# Patient Record
Sex: Female | Born: 1970 | Race: White | Hispanic: No | Marital: Married | Smoking: Former smoker
Health system: Southern US, Community
[De-identification: ages and names within clinical notes are randomized; demographics above are authoritative.]

## PROBLEM LIST (undated history)

## (undated) DIAGNOSIS — F319 Bipolar disorder, unspecified: Secondary | ICD-10-CM

## (undated) DIAGNOSIS — R569 Unspecified convulsions: Secondary | ICD-10-CM

## (undated) HISTORY — DX: Unspecified convulsions: R56.9

## (undated) HISTORY — PX: APPENDECTOMY: SHX54

## (undated) HISTORY — DX: Bipolar disorder, unspecified: F31.9

---

## 2008-04-19 ENCOUNTER — Emergency Department (HOSPITAL_COMMUNITY): Admission: EM | Admit: 2008-04-19 | Discharge: 2008-04-19 | Payer: Self-pay | Admitting: Emergency Medicine

## 2008-09-20 ENCOUNTER — Inpatient Hospital Stay (HOSPITAL_COMMUNITY): Admission: AD | Admit: 2008-09-20 | Discharge: 2008-09-21 | Payer: Self-pay | Admitting: *Deleted

## 2008-09-20 ENCOUNTER — Emergency Department (HOSPITAL_COMMUNITY): Admission: EM | Admit: 2008-09-20 | Discharge: 2008-09-20 | Payer: Self-pay | Admitting: Emergency Medicine

## 2008-09-20 ENCOUNTER — Ambulatory Visit: Payer: Self-pay | Admitting: *Deleted

## 2010-07-06 LAB — CBC
Hemoglobin: 11.5 g/dL — ABNORMAL LOW (ref 12.0–15.0)
MCHC: 32.8 g/dL (ref 30.0–36.0)
Platelets: 254 10*3/uL (ref 150–400)
RDW: 18.1 % — ABNORMAL HIGH (ref 11.5–15.5)

## 2010-07-06 LAB — BASIC METABOLIC PANEL
BUN: 3 mg/dL — ABNORMAL LOW (ref 6–23)
CO2: 26 mEq/L (ref 19–32)
Calcium: 8.9 mg/dL (ref 8.4–10.5)
Creatinine, Ser: 0.76 mg/dL (ref 0.4–1.2)
GFR calc non Af Amer: >60 mL/min (ref >60)
Glucose, Bld: 90 mg/dL (ref 70–99)
Sodium: 140 mEq/L (ref 135–145)

## 2010-07-06 LAB — RAPID URINE DRUG SCREEN, HOSP PERFORMED
Amphetamines: POSITIVE — AB
Cocaine: NOT DETECTED
Tetrahydrocannabinol: POSITIVE — AB

## 2010-07-06 LAB — TRICYCLICS SCREEN, URINE: TCA Scrn: NOT DETECTED

## 2010-07-06 LAB — POCT PREGNANCY, URINE: Preg Test, Ur: NEGATIVE

## 2010-07-06 LAB — DIFFERENTIAL
Basophils Absolute: 0 10*3/uL (ref 0.0–0.1)
Basophils Relative: 0 % (ref 0–1)
Lymphocytes Relative: 29 % (ref 12–46)
Neutro Abs: 4.3 10*3/uL (ref 1.7–7.7)
Neutrophils Relative %: 63 % (ref 43–77)

## 2010-07-13 LAB — COMPREHENSIVE METABOLIC PANEL
Alkaline Phosphatase: 61 U/L (ref 39–117)
BUN: 9 mg/dL (ref 6–23)
Chloride: 105 mEq/L (ref 96–112)
Creatinine, Ser: 0.65 mg/dL (ref 0.4–1.2)
Glucose, Bld: 91 mg/dL (ref 70–99)
Potassium: 3.7 mEq/L (ref 3.5–5.1)
Total Bilirubin: 0.5 mg/dL (ref 0.3–1.2)

## 2010-07-13 LAB — CBC
HCT: 35.4 % — ABNORMAL LOW (ref 36.0–46.0)
Hemoglobin: 11.6 g/dL — ABNORMAL LOW (ref 12.0–15.0)
MCV: 85.9 fL (ref 78.0–100.0)
Platelets: 279 10*3/uL (ref 150–400)
RDW: 16.5 % — ABNORMAL HIGH (ref 11.5–15.5)
WBC: 6.4 10*3/uL (ref 4.0–10.5)

## 2010-07-13 LAB — URINALYSIS, ROUTINE W REFLEX MICROSCOPIC
Bilirubin Urine: NEGATIVE
Ketones, ur: NEGATIVE mg/dL
Leukocytes, UA: NEGATIVE
Nitrite: NEGATIVE
Protein, ur: NEGATIVE mg/dL
pH: 6 (ref 5.0–8.0)

## 2010-07-13 LAB — WET PREP, GENITAL
Trich, Wet Prep: NONE SEEN
Yeast Wet Prep HPF POC: NONE SEEN

## 2010-07-13 LAB — DIFFERENTIAL
Basophils Absolute: 0.1 10*3/uL (ref 0.0–0.1)
Basophils Relative: 1 % (ref 0–1)
Lymphocytes Relative: 26 % (ref 12–46)
Neutro Abs: 4.1 10*3/uL (ref 1.7–7.7)
Neutrophils Relative %: 64 % (ref 43–77)

## 2010-07-13 LAB — HCG, QUANTITATIVE, PREGNANCY: hCG, Beta Chain, Quant, S: 17387 m[IU]/mL — ABNORMAL HIGH (ref <5)

## 2010-07-13 LAB — URINE MICROSCOPIC-ADD ON

## 2010-07-13 LAB — LIPASE, BLOOD: Lipase: 18 U/L (ref 11–59)

## 2010-07-13 LAB — PREGNANCY, URINE: Preg Test, Ur: POSITIVE

## 2010-08-11 NOTE — H&P (Signed)
Sherry Tate, Sherry Tate             ACCOUNT NO.:  [id number]   MEDICAL RECORD NO.:  [id number]          PATIENT TYPE:  IPS   LOCATION:  0303                          [facility]ILITY[facility]  [facility]   PHYSICIAN:  Syed T. Arfeen, M.D.   DATE OF BIRTH:  1970/11/05   DATE OF ADMISSION:  09/20/2008  DATE OF DISCHARGE:  09/21/2008                       PSYCHIATRIC ADMISSION ASSESSMENT   IDENTIFYING INFORMATION/JUSTIFICATION FOR ADMISSION AND CARE:  The  patient is a 40 year old female voluntarily admitted on September 20, 2008.   HISTORY OF PRESENT ILLNESS:  The patient reports that she was sent to  the emergency room after her therapist thought that she needed more  intensive therapy.  She was concerned about overtaking her Xanax and  feels this was misinterpreted and that she was having suicidal thoughts.  She does report a history of increased anxiety and was to see her  psychiatrist with this situation. She denies any suicidal thoughts.  She  wants to be there for as long as she can for her children, reporting  that she has an older child that has a history of some mental  disabilities.  She denies any substance use.  Again denies any suicidal  thoughts and is anxious to be discharged.   PAST PSYCHIATRIC HISTORY:  First admission she has a therapist named Vernell Leep. Sees Dr. Lafayette Dragon and has an appointment on September 21, 2008.   SOCIAL HISTORY:  The patient is married and has two children.  No legal  problems.   FAMILY HISTORY:  None.   ALCOHOL/DRUG HISTORY:  The patient smokes, but denies any alcohol or  drug use.   PRIMARY CARE PHYSICIAN:  Dr. Katrinka Blazing in [facility].   PAST MEDICAL HISTORY:  Denies any acute or chronic health issues.   MEDICATIONS:  1. Cymbalta 60 mg daily.  2. Xanax 1 mg four times a day, taking 1 mg twice a day and taking 2      mg at bedtime.  3. Adderall.   DRUG ALLERGIES:  No known allergies.   PHYSICAL EXAMINATION:  GENERAL:  This is a young female fully assessed  at [facility] emergency department.  She was seen at [facility]. She  appears in no acute distress. She offers no complaints.  VITAL SIGNS:  Temperature 97.4. Heart rate 91, respiratory rate 20.  Blood pressure is 117/64.   LABORATORY DATA:  Her B-met is within normal limits.  Alcohol level less  than 5.  Urine drug screen is positive for benzodiazepines, positive for  amphetamines, positive for marijuana. Hemoglobin 11.5, hematocrit 34.9.   MENTAL STATUS EXAM:  She is fully alert, cooperative. Denies any  hallucinations. No delusional statements. She provides a good history.  Her memory is intact.  Judgment and insight appear good.   DIAGNOSES:  AXIS I:  Anxiety disorder.  Cannabis abuse.  AXIS II:  Deferred.  AXIS III:  No known medical conditions.  AXIS IV:  Possible psychosocial problems rated to the children's  illness.  AXIS V:  Current is 50.   PLAN:  Our plan is to contact her husband for concerns and support,  which was done. The patient's husband has no concerns about the patient  returning home. We clarified appointment today with Dr. Lafayette Dragon and husband  will come pick up the patient to be there for appointment with her  psychiatrist.  The patient also was agreeable to the IOP program.  Information was provided and listed on her discharge instructions.   DISPOSITION:  The patient was discharged to home with her husband and  got to meet with  Dr. Lafayette Dragon at 12:15 on Saturday 06/26.  Discharge  instructions listed.   MEDICATIONS:  No prescriptions.   DISCHARGE DIAGNOSES:  AXIS I:  Anxiety disorder and Cannabis abuse.  AXIS II:  Deferred.  AXIS III:  No known medical conditions.  AXIS IV:.  Possible psychosocial problems related to child's illness.  AXIS V:  Current is 65.      Landry Corporal, N.P.      Syed T. Lolly Mustache, M.D.  Electronically Signed    JO/MEDQ  D:  09/21/2008  T:  09/21/2008  Job:  [id number]

## 2010-08-14 NOTE — Discharge Summary (Signed)
NAMEIVELISE, CASTILLO             ACCOUNT NO.:  [id number]   MEDICAL RECORD NO.:  [id number]          PATIENT TYPE:  IPS   LOCATION:  0303                          [facility]ACILITY:  [facility]   PHYSICIAN:  Syed T. Arfeen, M.D.   DATE OF BIRTH:  04-19-1970   DATE OF ADMISSION:  09/20/2008  DATE OF DISCHARGE:  09/21/2008                               DISCHARGE SUMMARY   The patient is a 40 year old white married woman.  She was admitted due  to increased anxiety and depression.  She was seen by her therapist, Vernell Leep, on June 25, and she endorsed worsening of her depression and  believed she may end up taking more Xanax.  She also mentioned passive  suicidal ideation but no plan.  She mentioned that she talked to  therapist that she might need intensive outpatient program.  However,  when she evaluated by assessment in the ER, it was felt that she needed  inpatient treatment.  The patient reported that she had increased  anxiety for past few weeks concerned about her 2 children, who are 12-  years and 8-years old, and one of them requires special needs.  The  patient denies any suicidal thoughts in the unit.  The patient was very  upset and tearful and requesting to be discharged and mentioned that she  never had any suicidal thoughts and is all miscommunication.  She said  that she thought she will assess for intensive outpatient treatment and  did not realize that she was coming for inpatient treatment.  The  patient was concerned about the child who has some mental disability,  and she wanted to go home.  Please see admission note for details.   PAST PSYCHIATRIC HISTORY:  The patient denies ever being admitted in a  psych unit.  She denies ever past history of suicidal attempt.  She is  scheduled to see Dr. Evelene Croon today at 12:30 for assessment.  She has been  getting Cymbalta from the [facility], and she is also getting Xanax up to  4 a day as needed.  The patient has follow up with Dr.  Evelene Croon.   MEDICAL HISTORY:  She denies any history of active medical illness.   SUBSTANCE ABUSE:  She denies any history of substance abuse.   HOSPITAL COURSE:  The patient was admitted and restarted her on Cymbalta  and Xanax p.r.n.  The patient offers no complaints but requested to be  discharged as she is concerned about her children, especially 32-year-old  who has mild MR.  She does not exhibit any psychosis or suicidal  thoughts.  She did not complain of any side effects of medication.  She  reported a supportive husband.  We talked about intensive outpatient  treatment which she agreed, and she said that was her plan in the  beginning, and she still wanted to be in an intensive outpatient  program.  A family session was scheduled with the husband.  Counselor  spoke with the patient's husband about the potential discharge.  Husband  reports he feels comfortable with the patient being discharged today.  Husband  does not believe that it was ever suicidal but she was only  depressed and concerned about over using her Xanax.  Husband said there  are no guns in the home, and he has no concern for the patient's safety.  The husband agreed to take the patient back home.  We talked about this  IOP program.  The patient said she will start the IOP program early next  week.  The patient also had an appointment the same day at 12:30 p.m.  with Dr. Evelene Croon.   CONDITION ON DISCHARGE:  Remarkably improved.  Increased coping and  social skills.  She denies any suicidal thoughts or homicidal thoughts  or any hallucination.  Her affect appears to be bright, and there were  no side effects of the medication.   DISCHARGE DIAGNOSIS:  AXIS I:  Major depressive disorder, most recent  episode.  AXIS II:  Deferred.  AXIS III:  None.  AXIS IV:  Mild.  AXIS V:  60.   DISCHARGE MEDICATIONS:  Will resume her Cymbalta 60 mg daily and Xanax 1  mg 1-3 tablets as needed.  However, the patient noted not taking  the  Xanax more than 2 a day.  She was also instructed to resume her Adderall  which was given by Dr. Evelene Croon as needed.   DISCHARGE DISPOSITION:  As mentioned above, the patient was discharged  home with her husband, and she would also see Dr. Evelene Croon at 12:30 p.m. on  June 26.      Syed T. Lolly Mustache, M.D.  Electronically Signed     STA/MEDQ  D:  09/25/2008  T:  09/25/2008  Job:  [id number]

## 2010-12-08 ENCOUNTER — Other Ambulatory Visit: Payer: Self-pay | Admitting: Orthopedic Surgery

## 2010-12-08 DIAGNOSIS — M545 Low back pain: Secondary | ICD-10-CM

## 2010-12-16 ENCOUNTER — Ambulatory Visit
Admission: RE | Admit: 2010-12-16 | Discharge: 2010-12-16 | Disposition: A | Discharge status: Home / Self Care | Payer: Managed Care, Other (non HMO) | Source: Ambulatory Visit | Attending: Orthopedic Surgery | Admitting: Orthopedic Surgery

## 2010-12-16 DIAGNOSIS — M545 Low back pain: Secondary | ICD-10-CM

## 2011-01-18 ENCOUNTER — Other Ambulatory Visit: Payer: Self-pay | Admitting: Internal Medicine

## 2011-01-22 ENCOUNTER — Inpatient Hospital Stay
Admission: RE | Admit: 2011-01-22 | Discharge: 2011-01-22 | Payer: Managed Care, Other (non HMO) | Source: Ambulatory Visit | Attending: Internal Medicine | Admitting: Internal Medicine

## 2011-02-11 ENCOUNTER — Other Ambulatory Visit: Payer: Managed Care, Other (non HMO)

## 2011-02-12 ENCOUNTER — Ambulatory Visit
Admission: RE | Admit: 2011-02-12 | Discharge: 2011-02-12 | Disposition: A | Discharge status: Home / Self Care | Payer: Managed Care, Other (non HMO) | Source: Ambulatory Visit | Attending: Internal Medicine | Admitting: Internal Medicine

## 2011-02-12 MED ORDER — GADOBENATE DIMEGLUMINE 529 MG/ML IV SOLN
12.0000 mL | Freq: Once | INTRAVENOUS | Status: AC | PRN
  Administered 2011-02-12: 12 mL via INTRAVENOUS

## 2012-01-11 ENCOUNTER — Other Ambulatory Visit: Payer: Self-pay | Admitting: Pain Medicine

## 2012-01-11 DIAGNOSIS — M545 Low back pain: Secondary | ICD-10-CM

## 2012-01-18 ENCOUNTER — Inpatient Hospital Stay: Admission: RE | Admit: 2012-01-18 | Payer: Managed Care, Other (non HMO) | Source: Ambulatory Visit

## 2012-01-21 ENCOUNTER — Other Ambulatory Visit: Payer: Managed Care, Other (non HMO)

## 2012-10-12 ENCOUNTER — Other Ambulatory Visit: Payer: Self-pay | Admitting: Family Medicine

## 2012-10-12 DIAGNOSIS — R102 Pelvic and perineal pain: Secondary | ICD-10-CM

## 2012-10-16 ENCOUNTER — Ambulatory Visit
Admission: RE | Admit: 2012-10-16 | Discharge: 2012-10-16 | Disposition: A | Discharge status: Home / Self Care | Payer: Managed Care, Other (non HMO) | Source: Ambulatory Visit | Attending: Family Medicine | Admitting: Family Medicine

## 2012-10-16 DIAGNOSIS — R102 Pelvic and perineal pain: Secondary | ICD-10-CM

## 2013-02-27 ENCOUNTER — Other Ambulatory Visit: Payer: Self-pay

## 2013-02-27 DIAGNOSIS — Z1231 Encounter for screening mammogram for malignant neoplasm of breast: Secondary | ICD-10-CM

## 2013-04-03 ENCOUNTER — Ambulatory Visit
Admission: RE | Admit: 2013-04-03 | Discharge: 2013-04-03 | Disposition: A | Discharge status: Home / Self Care | Payer: Managed Care, Other (non HMO) | Source: Ambulatory Visit

## 2013-04-03 DIAGNOSIS — Z1231 Encounter for screening mammogram for malignant neoplasm of breast: Secondary | ICD-10-CM

## 2013-07-05 ENCOUNTER — Encounter (HOSPITAL_COMMUNITY): Payer: Self-pay | Admitting: Emergency Medicine

## 2013-07-05 ENCOUNTER — Emergency Department (HOSPITAL_COMMUNITY): Payer: Managed Care, Other (non HMO)

## 2013-07-05 ENCOUNTER — Emergency Department (HOSPITAL_COMMUNITY)
Admission: EM | Admit: 2013-07-05 | Discharge: 2013-07-05 | Disposition: A | Discharge status: Home / Self Care | Payer: Managed Care, Other (non HMO) | Attending: Emergency Medicine | Admitting: Emergency Medicine

## 2013-07-05 DIAGNOSIS — Z87891 Personal history of nicotine dependence: Secondary | ICD-10-CM | POA: Insufficient documentation

## 2013-07-05 DIAGNOSIS — R1013 Epigastric pain: Secondary | ICD-10-CM | POA: Insufficient documentation

## 2013-07-05 DIAGNOSIS — K224 Dyskinesia of esophagus: Secondary | ICD-10-CM | POA: Insufficient documentation

## 2013-07-05 LAB — CBC
HCT: 42.7 % (ref 36.0–46.0)
Hemoglobin: 14.3 g/dL (ref 12.0–15.0)
MCH: 32.4 pg (ref 26.0–34.0)
MCHC: 33.5 g/dL (ref 30.0–36.0)
MCV: 96.6 fL (ref 78.0–100.0)
Platelets: 242 10*3/uL (ref 150–400)
RBC: 4.42 MIL/uL (ref 3.87–5.11)
RDW: 12.8 % (ref 11.5–15.5)
WBC: 5.9 10*3/uL (ref 4.0–10.5)

## 2013-07-05 LAB — URINE MICROSCOPIC-ADD ON

## 2013-07-05 LAB — URINALYSIS, ROUTINE W REFLEX MICROSCOPIC
Bilirubin Urine: NEGATIVE
Glucose, UA: NEGATIVE mg/dL
KETONES UR: NEGATIVE mg/dL
Nitrite: NEGATIVE
PROTEIN: NEGATIVE mg/dL
Specific Gravity, Urine: 1.008 (ref 1.005–1.030)
UROBILINOGEN UA: 0.2 mg/dL (ref 0.0–1.0)
pH: 7.5 (ref 5.0–8.0)

## 2013-07-05 LAB — COMPREHENSIVE METABOLIC PANEL
ALT: 26 U/L (ref 0–35)
AST: 23 U/L (ref 0–37)
Albumin: 4.4 g/dL (ref 3.5–5.2)
Alkaline Phosphatase: 52 U/L (ref 39–117)
BILIRUBIN TOTAL: 0.2 mg/dL — AB (ref 0.3–1.2)
BUN: 9 mg/dL (ref 6–23)
CO2: 27 meq/L (ref 19–32)
Calcium: 10 mg/dL (ref 8.4–10.5)
Chloride: 103 mEq/L (ref 96–112)
Creatinine, Ser: 0.79 mg/dL (ref 0.50–1.10)
GFR calc Af Amer: >90 mL/min (ref >90)
GLUCOSE: 93 mg/dL (ref 70–99)
Potassium: 4.8 mEq/L (ref 3.7–5.3)
SODIUM: 143 meq/L (ref 137–147)
Total Protein: 7.4 g/dL (ref 6.0–8.3)

## 2013-07-05 LAB — LIPASE, BLOOD: Lipase: 19 U/L (ref 11–59)

## 2013-07-05 LAB — I-STAT TROPONIN, ED: Troponin i, poc: 0 ng/mL (ref 0.00–0.08)

## 2013-07-05 LAB — POC URINE PREG, ED: Preg Test, Ur: NEGATIVE

## 2013-07-05 MED ORDER — GI COCKTAIL ~~LOC~~
30.0000 mL | Freq: Once | ORAL | Status: AC
  Administered 2013-07-05: 30 mL via ORAL
  Filled 2013-07-05: qty 30

## 2013-07-05 MED ORDER — IOHEXOL 300 MG/ML  SOLN
25.0000 mL | INTRAMUSCULAR | Status: AC
  Administered 2013-07-05: 25 mL via ORAL

## 2013-07-05 MED ORDER — LORAZEPAM 2 MG/ML IJ SOLN
1.0000 mg | Freq: Once | INTRAMUSCULAR | Status: AC
  Administered 2013-07-05: 1 mg via INTRAVENOUS
  Filled 2013-07-05: qty 1

## 2013-07-05 MED ORDER — SODIUM CHLORIDE 0.9 % IV BOLUS (SEPSIS)
1000.0000 mL | Freq: Once | INTRAVENOUS | Status: AC
  Administered 2013-07-05: 1000 mL via INTRAVENOUS

## 2013-07-05 MED ORDER — IOHEXOL 300 MG/ML  SOLN
80.0000 mL | Freq: Once | INTRAMUSCULAR | Status: AC | PRN
  Administered 2013-07-05: 75 mL via INTRAVENOUS

## 2013-07-05 MED ORDER — DIAZEPAM 5 MG PO TABS: 5.0000 mg | ORAL_TABLET | Freq: Three times a day (TID) | ORAL | Status: DC | PRN

## 2013-07-05 MED ORDER — ONDANSETRON HCL 4 MG/2ML IJ SOLN
4.0000 mg | Freq: Once | INTRAMUSCULAR | Status: AC
  Administered 2013-07-05: 4 mg via INTRAVENOUS
  Filled 2013-07-05: qty 2

## 2013-07-05 MED ORDER — DIPHENHYDRAMINE HCL 50 MG/ML IJ SOLN
25.0000 mg | Freq: Once | INTRAMUSCULAR | Status: AC
  Administered 2013-07-05: 25 mg via INTRAVENOUS
  Filled 2013-07-05: qty 1

## 2013-07-05 MED ORDER — MORPHINE SULFATE 4 MG/ML IJ SOLN
4.0000 mg | Freq: Once | INTRAMUSCULAR | Status: AC
  Administered 2013-07-05: 4 mg via INTRAVENOUS
  Filled 2013-07-05: qty 1

## 2013-07-05 NOTE — ED Provider Notes (Addendum)
CSN: [id number]     Arrival date & time 07/05/13  [id number] History   First MD Initiated Contact with Patient 07/05/13 [phone]     Chief Complaint  Patient presents with  . Chest Pain     (Consider location/radiation/quality/duration/timing/severity/associated sxs/prior Treatment) Patient is a 43 y.o. female presenting with abdominal pain. The history is provided by the patient.  Abdominal Pain Pain location:  Epigastric Pain quality: sharp, stabbing and throbbing   Pain radiates to:  Does not radiate Pain severity:  Severe Onset quality:  Sudden Duration:  12 hours Timing:  Constant Progression:  Worsening Chronicity:  New Context comment:  Started abruptly at 8pm when she was doing nothing.  she had eaten appx 1-2 hours prior to start of pain Relieved by:  Nothing Worsened by:  Deep breathing and movement Ineffective treatments:  None tried Associated symptoms: no chest pain, no chills, no constipation, no cough, no diarrhea, no dysuria, no fever, no nausea, no shortness of breath and no vomiting   Risk factors comment:  Hx of complicated appy with bowel perf  s/p 3 surgerys 11 years ago   History reviewed. No pertinent past medical history. History reviewed. No pertinent past surgical history. History reviewed. No pertinent family history. History  Substance Use Topics  . Smoking status: Former Games developermoker  . Smokeless tobacco: Never Used  . Alcohol Use: No   OB History   Grav Para Term Preterm Abortions TAB SAB Ect Mult Living                 Review of Systems  Constitutional: Negative for fever and chills.  Respiratory: Negative for cough and shortness of breath.   Cardiovascular: Negative for chest pain.  Gastrointestinal: Positive for abdominal pain. Negative for nausea, vomiting, diarrhea and constipation.  Genitourinary: Negative for dysuria.  All other systems reviewed and are negative.     Allergies  Sulfa antibiotics  Home Medications  No current outpatient  prescriptions on file. BP 103/67  Pulse 83  Temp(Src) 97.9 F (36.6 C) (Oral)  Resp 20  Ht 5\' 6"  (1.676 m)  Wt 127 lb 3.2 oz (57.698 kg)  BMI 20.54 kg/m2  SpO2 100%  LMP 06/28/2013 Physical Exam  Nursing note and vitals reviewed. Constitutional: She is oriented to person, place, and time. She appears well-developed and well-nourished. She appears distressed.  Appears uncomfortable and intermittently groans in pain during the exam sitting straight up  HENT:  Head: Normocephalic and atraumatic.  Mouth/Throat: Oropharynx is clear and moist.  Eyes: Conjunctivae and EOM are normal. Pupils are equal, round, and reactive to light.  Neck: Normal range of motion. Neck supple.  Cardiovascular: Normal rate, regular rhythm and intact distal pulses.   No murmur heard. Pulmonary/Chest: Effort normal and breath sounds normal. No respiratory distress. She has no wheezes. She has no rales.  Abdominal: Soft. She exhibits no distension. There is tenderness in the epigastric area. There is guarding. There is no rebound.  Musculoskeletal: Normal range of motion. She exhibits no edema and no tenderness.  Neurological: She is alert and oriented to person, place, and time.  Skin: Skin is warm and dry. No rash noted. No erythema.  Psychiatric: She has a normal mood and affect. Her behavior is normal.    ED Course  Procedures (including critical care time) Labs Review Labs Reviewed  COMPREHENSIVE METABOLIC PANEL - Abnormal; Notable for the following:    Total Bilirubin 0.2 (*)    All other components within normal limits  URINALYSIS, ROUTINE W REFLEX MICROSCOPIC - Abnormal; Notable for the following:    Hgb urine dipstick SMALL (*)    Leukocytes, UA TRACE (*)    All other components within normal limits  URINE MICROSCOPIC-ADD ON - Abnormal; Notable for the following:    Squamous Epithelial / LPF MANY (*)    Bacteria, UA MANY (*)    All other components within normal limits  CBC  LIPASE, BLOOD   I-STAT TROPOININ, ED  POC URINE PREG, ED  I-STAT CG4 LACTIC ACID, ED   Imaging Review Ct Abdomen Pelvis W Contrast  07/05/2013   CLINICAL DATA:  43 year old female with epigastric pain. Initial encounter.  EXAM: CT ABDOMEN AND PELVIS WITH CONTRAST  TECHNIQUE: Multidetector CT imaging of the abdomen and pelvis was performed using the standard protocol following bolus administration of intravenous contrast.  CONTRAST:  75mL OMNIPAQUE IOHEXOL 300 MG/ML  SOLN  COMPARISON:  Acute abdominal series 0804 hr the same day.  FINDINGS: No pericardial or pleural effusion.  Minor lung base atelectasis.  Scoliosis. Lower lumbar chronic disc and endplate degeneration. No acute osseous abnormality identified.  Small volume pelvic free fluid. Uterus and adnexa within normal limits for age (retroverted uterus). Negative bladder.  Decompressed distal colon. Redundant transverse colon, gas distended. The cecum appears to be in the midline distended with gas and some stool. The ileocecal valve is not clearly delineated. No pericecal inflammation is evident. Some distal small bowel loops contain fluid. Oral contrast has not yet reached the distal small bowel. No dilated small bowel loops are identified. The stomach is distended with contrast. Second portion the duodenum mildly distended with contrast.  Liver, gallbladder, spleen, pancreas adrenal glands, and kidneys are within normal limits.  Portal venous system is patent. Major arterial structures in the abdomen and pelvis are patent. Minimal distal aorta calcified atherosclerosis. No abdominal free fluid. No lymphadenopathy identified. No hydronephrosis or hydroureter.  IMPRESSION: 1. Intermittent gaseous distension of the colon. No evidence of bowel obstruction. No inflamed bowel identified. 2. Probably physiologic small volume pelvic free fluid.   Electronically Signed   By: Augusto Gamble M.D.   On: 07/05/2013 10:49   Dg Abd Acute W/chest  07/05/2013   CLINICAL DATA:  Epigastric  pain  EXAM: ACUTE ABDOMEN SERIES (ABDOMEN 2 VIEW & CHEST 1 VIEW)  COMPARISON:  10/29/2008  FINDINGS: Cardiomediastinal silhouette is unremarkable. There is dextroscoliosis of thoracic spine. No acute infiltrate or pleural effusion.  Mild levoscoliosis of the lumbar spine. There is stool and moderate gas within colon. Nonspecific nonobstructive bowel gas pattern. No free abdominal air.  IMPRESSION: No acute disease within chest. Thoracolumbar scoliosis. Moderate gas and some stool within colon. Nonspecific nonobstructive bowel gas pattern.   Electronically Signed   By: Natasha Mead M.D.   On: 07/05/2013 08:13     EKG Interpretation None      Date: 07/05/2013  Rate: 81  Rhythm: normal sinus rhythm  QRS Axis: normal  Intervals: normal  ST/T Wave abnormalities: normal  Conduction Disutrbances: none  Narrative Interpretation: unremarkable     MDM   Final diagnoses:  Epigastric pain  Esophageal spasm    Patient presents with epigastric pain that started abruptly at 8 PM last night and it's been getting worse for the last 12 hours. She denies nausea, vomiting, diarrhea. The pain does not radiate. Pain is worse with movement or taking deep breaths. She did not endorse or shortness of breath but pain with breathing. On exam she has significant epigastric  pain but no other positive exam findings. Prior history of appendectomy with multiple surgeries due to perf bowel during the procedure 11 years ago but no history of recurrent small bowel obstruction.  Patient denies a history of gastric ulcers or heartburn.  Vital signs are within normal limits an EKG is within normal limits. Low suspicion for cardiac etiology feel that patient's symptoms are coming from a GI pathology. Concern for perforated bowel versus cholecystitis versus pancreatitis. Low suspicion for PE and patient is PERC negative.  CBC, CMP, lipase, troponin, UA and pregnancy, lactate, acute abdominal series pending.  9:33 AM All labs  wnl.  AAS without acute findings.  Pt having some pain relief with morphine but still getting spasms in the epigastric area.  Will get CT to r/o microperfortation and pt given GI cocktail for sx relief.  11:07 AM CT neg for acute issues at this time of vascular structures or abd structures.  Some relief with gi cocktail and given ativan for spasm. Spasm and pain improved after ativan.  Pt did start a new urology med appx 1 hour before sx started yesterday and recommended d/c of this.  Will d/c home with valium and f/u with PCP or GI and return for worsening sx.  Gwyneth Sprout, MD 07/05/13 1137  Gwyneth Sprout, MD 07/05/13 1139

## 2013-07-05 NOTE — ED Notes (Signed)
CT made aware pt finished drinking PO contrast. 

## 2013-07-05 NOTE — ED Notes (Signed)
Pt returned from CT °

## 2013-07-05 NOTE — ED Notes (Signed)
Pt c/o substernal CP since last night. Pain exacerbated by movement, pain relived by nothing. Denies sweating, nausea.

## 2013-07-05 NOTE — ED Notes (Signed)
Pt reports spasms are less frequent.

## 2014-04-23 ENCOUNTER — Encounter: Payer: Self-pay | Admitting: Neurology

## 2014-04-23 ENCOUNTER — Ambulatory Visit (INDEPENDENT_AMBULATORY_CARE_PROVIDER_SITE_OTHER): Payer: Managed Care, Other (non HMO) | Admitting: Neurology

## 2014-04-23 VITALS — BP 110/78 | HR 111 | Resp 16 | Ht 66.0 in | Wt 127.0 lb

## 2014-04-23 DIAGNOSIS — R569 Unspecified convulsions: Secondary | ICD-10-CM

## 2014-04-23 NOTE — Patient Instructions (Signed)
1. Schedule MRI brain with and without contrast 2. Schedule routine EEG, then 24-hour EEG 3. Continue with increasing doses of Lamictal as planned 4. As per [place] driving laws, one should not drive until 6 months seizure-free  Seizure Precautions: 1. If medication has been prescribed for you to prevent seizures, take it exactly as directed.  Do not stop taking the medicine without talking to your doctor first, even if you have not had a seizure in a long time.   2. Avoid activities in which a seizure would cause danger to yourself or to others.  Don't operate dangerous machinery, swim alone, or climb in high or dangerous places, such as on ladders, roofs, or girders.  Do not drive unless your doctor says you may.  3. If you have any warning that you may have a seizure, lay down in a safe place where you can't hurt yourself.    4.  No driving for 6 months from last seizure, as per [place] [facility] [place] law.   Please refer to the following link on the [employer]'s website for more information: http://www.epilepsyfoundation.org/answerplace/Social/driving/drivingu.cfm   5.  Maintain good sleep hygiene.  6.  Notify your neurology if you are planning pregnancy or if you become pregnant.  7.  Contact your doctor if you have any problems that may be related to the medicine you are taking.  8.  Call 911 and bring the patient back to the ED if:        A.  The seizure lasts longer than 5 minutes.       B.  The patient doesn't awaken shortly after the seizure  C.  The patient has new problems such as difficulty seeing, speaking or moving  D.  The patient was injured during the seizure  E.  The patient has a temperature over 102 F (39C)  F.  The patient vomited and now is having trouble breathing

## 2014-04-23 NOTE — Progress Notes (Signed)
NEUROLOGY CONSULTATION NOTE  Sherry Tate MRN: [id number] DOB: October 20, 1970  Referring provider: Dr. Martha Clan Primary care provider: Dr. Martha Clan  Reason for consult:  2 seizures, dizziness, memory issues  Dear Dr Clelia Croft:  Thank you for your kind referral of Sherry Tate for consultation of the above symptoms. Although her history is well known to you, please allow me to reiterate it for the purpose of our medical record. The patient was accompanied to the clinic by her husband and 2 daughters who also provide collateral information. Records and images were personally reviewed where available.  HISTORY OF PRESENT ILLNESS: This is a pleasant 44 year old right-handed woman with a history of bipolar disorder, in her usual state of health until around 2-3 months ago when she had a nocturnal seizure witnessed by her husband. She was stiff and shaking for 20-30 seconds, then went back to sleep. She woke up with her muscles sore, no focal weakness, no tongue bite or incontinence. It appears she did nto seek medical attention until after another seizure on 04/12/2014 in the morning while she was doing spelling words with her daughter. She recalls saying they had to get going, then she suddenly fell back shaking and moaning. Her daughter called her husband, and when she came to, he had come back home. Her daughter reports she was answering "no" to all the questions. She is amnestic of the event. She denies any prior warning symptoms.  She denies any olfactory/gustatory hallucinations, deja vu, rising epigastric sensation, focal numbness/tingling/weakness, myoclonic jerks. She denies any alcohol or sleep deprivation prior to the seizures.   She had been taking Lamictal  qAM for 3-4 years but did not feel it was helping and tapered it off after discussing this with her psychiatrist. She had been completely off Lamictal for 2 months. She had asked to be restarted on Zoloft, which he had used  for many years in the past. She had been taking Zoloft for 2 months now. She takes Xanax XR  every morning for the past 6-9 months, no change in dose, no missed doses. She has been taking Saphris for 3-4 years, which helps her sleep. For several years she has had mild 4/10 daily headaches that resolve after 30 minutes of taking Excedrin. There is no associated nausea, vomiting, photo/phonophobia, visual obscurations. No diplopia, dysarthria, dysphagia, neck pain. She has low back pain and an overactive bladder. She reports her memory is "horrible," she usually forgets what she went into a room for. She does not get lost driving, does not forget to take her medications. She has occasional tingling in both feet.  Epilepsy Risk Factors:  She had a normal birth and early development.  There is no history of febrile convulsions, CNS infections such as meningitis/encephalitis, significant traumatic brain injury, neurosurgical procedures, or family history of seizures.  PAST MEDICAL HISTORY: Bipolar disorder  PAST SURGICAL HISTORY: Past Surgical History  Procedure Laterality Date  . Appendectomy      MEDICATIONS: Current Outpatient Prescriptions on File Prior to Visit  Medication Sig Dispense Refill  . asenapine (SAPHRIS) 5 MG SUBL 24 hr tablet Place 5 mg under the tongue daily.    . silodosin (RAPAFLO) 8 MG CAPS capsule Take 8 mg by mouth daily with breakfast.    Xanax  daily Adderall  BID Lamictal  daily Seroquel  3 tablets daily No current facility-administered medications on file prior to visit.    ALLERGIES: Allergies  Allergen Reactions  . Sulfa Antibiotics  jaundice    FAMILY HISTORY: History reviewed. No pertinent family history.  SOCIAL HISTORY: History   Social History  . Marital Status: Married    Spouse Name: N/A    Number of Children: N/A  . Years of Education: N/A   Occupational History  . Not on file.   Social History Main Topics  . Smoking  status: Former Games developermoker  . Smokeless tobacco: Never Used  . Alcohol Use: No  . Drug Use: No  . Sexual Activity: Yes   Other Topics Concern  . Not on file   Social History Narrative    REVIEW OF SYSTEMS: Constitutional: No fevers, chills, or sweats, no generalized fatigue, change in appetite Eyes: No visual changes, double vision, eye pain Ear, nose and throat: No hearing loss, ear pain, nasal congestion, sore throat Cardiovascular: No chest pain, palpitations Respiratory:  No shortness of breath at rest or with exertion, wheezes GastrointestinaI: No nausea, vomiting, diarrhea, abdominal pain, fecal incontinence Genitourinary:  No dysuria, urinary retention or frequency Musculoskeletal:  No neck pain, back pain Integumentary: No rash, pruritus, skin lesions Neurological: as above Psychiatric: No depression, insomnia, anxiety Endocrine: No palpitations, fatigue, diaphoresis, mood swings, change in appetite, change in weight, increased thirst Hematologic/Lymphatic:  No anemia, purpura, petechiae. Allergic/Immunologic: no itchy/runny eyes, nasal congestion, recent allergic reactions, rashes  PHYSICAL EXAM: Filed Vitals:   04/23/14 0827  BP: 110/78  Pulse: 111  Resp: 16   General: No acute distress Head:  Normocephalic/atraumatic Eyes: Fundoscopic exam shows bilateral sharp discs, no vessel changes, exudates, or hemorrhages Neck: supple, no paraspinal tenderness, full range of motion Back: No paraspinal tenderness Heart: regular rate and rhythm Lungs: Clear to auscultation bilaterally. Vascular: No carotid bruits. Skin/Extremities: No rash, no edema Neurological Exam: Mental status: alert and oriented to person, place, and time, no dysarthria or aphasia, Fund of knowledge is appropriate.  Recent and remote memory are intact.  3/3 delayed recall. Attention and concentration are normal.    Able to name objects and repeat phrases. Cranial nerves: CN I: not tested CN II: pupils  equal, round and reactive to light, visual fields intact, fundi unremarkable. CN III, IV, VI:  full range of motion, no nystagmus, no ptosis CN V: facial sensation intact CN VII: upper and lower face symmetric CN VIII: hearing intact to finger rub CN IX, X: gag intact, uvula midline CN XI: sternocleidomastoid and trapezius muscles intact CN XII: tongue midline Bulk & Tone: normal, no fasciculations. Motor: 5/5 throughout with no pronator drift. Sensation: intact to light touch, cold, pin, vibration and joint position sense.  No extinction to double simultaneous stimulation.  Romberg test negative Deep Tendon Reflexes: +1 throughout, no ankle clonus Plantar responses: downgoing bilaterally Cerebellar: no incoordination on finger to nose, heel to shin. No dysdiadochokinesia Gait: narrow-based and steady, able to tandem walk adequately. Tremor: none  IMPRESSION: This is a pleasant 44 year old right-handed woman with a history of bipolar disorder presenting with 2 generalized convulsions in the past 2-3 months. The first occurred out of sleep, the second on 04/12/14 occurred during wakefulness. She has no clear epilepsy risk factors, normal neurological exam, however we discussed that patients with a single unprovoked seizure have a recurrence rate of 33% after a single seizure and 73% after a second seizure. There has been a question regarding Zoloft provoking the seizure, which I am not entirely convinced is the case, although curious that seizure occurred during this time frame. Also note that she was tapered off Lamictal  at that time, which is an anti-epileptic medication. We discussed that after an initial seizure, unless there are significant risk factors, an abnormal neurological exam, an EEG showing epileptiform abnormalities, and/or abnormal neuroimaging, treatment with an antiepileptic drug is not indicated, however since this is her second seizure, I agree with resuming Lamictal. She is on  an uptitration schedule. MRI brain with and without contrast and routine EEG will be ordered to assess for focal abnormalities that increase risk for recurrent seizures. If routine EEG is normal, a 24-hour EEG will be done to further classify her seizures.   We discussed [place] driving restrictions which indicate a patient needs to free of seizures or events of altered awareness for 6 months prior to resuming driving. The patient agreed to comply with these restrictions.  Seizure precautions were discussed which include no driving, no bathing in a tub, no swimming alone, no cooking over an open flame, no operating dangerous machinery, and no activities which may endanger oneself or someone else. Avoidance of seizure triggers, including missed medications, alcohol, and sleep deprivation, were also discussed. She will follow-up after the tests.   Thank you for allowing me to participate in the care of this patient. Please do not hesitate to call for any questions or concerns.   Patrcia Dolly, M.D.  CC: Dr. Clelia Croft

## 2014-04-26 ENCOUNTER — Encounter: Payer: Self-pay | Admitting: Neurology

## 2014-04-28 ENCOUNTER — Ambulatory Visit
Admission: RE | Admit: 2014-04-28 | Discharge: 2014-04-28 | Disposition: A | Discharge status: Home / Self Care | Payer: Managed Care, Other (non HMO) | Source: Ambulatory Visit | Attending: Neurology | Admitting: Neurology

## 2014-04-28 MED ORDER — GADOBENATE DIMEGLUMINE 529 MG/ML IV SOLN
10.0000 mL | Freq: Once | INTRAVENOUS | Status: AC | PRN
  Administered 2014-04-28: 10 mL via INTRAVENOUS

## 2014-04-29 ENCOUNTER — Ambulatory Visit (INDEPENDENT_AMBULATORY_CARE_PROVIDER_SITE_OTHER): Payer: Managed Care, Other (non HMO) | Admitting: Neurology

## 2014-04-29 DIAGNOSIS — R569 Unspecified convulsions: Secondary | ICD-10-CM

## 2014-05-01 ENCOUNTER — Ambulatory Visit (INDEPENDENT_AMBULATORY_CARE_PROVIDER_SITE_OTHER): Payer: Managed Care, Other (non HMO) | Admitting: Neurology

## 2014-05-01 DIAGNOSIS — R569 Unspecified convulsions: Secondary | ICD-10-CM

## 2014-05-02 ENCOUNTER — Telehealth: Payer: Self-pay | Admitting: Neurology

## 2014-05-02 NOTE — Telephone Encounter (Signed)
Discussed MRI and EEG findings. EEG had shown rare left frontal sharp waves. 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 I would lean toward the latter with the sharp waves seen on the left frontal region. She is still on uptitration of Lamictal, doing well with no seizures or side effects. She will follow-up as scheduled, continue with lamictal uptitration as planned. All her questions were answered.

## 2014-05-14 NOTE — Procedures (Signed)
ELECTROENCEPHALOGRAM REPORT  Date of Study: 04/29/2014  Patient's Name: Sherry Tate MRN: [id number] Date of Birth: 11-04-1970  Referring Provider: Dr. Patrcia DollyKaren Tine Mabee  Clinical History: This is a 44 year old woman with new onset seizures  Medications: Lamictal, Xanax, Adderall, Seroquel, Saphris  Technical Summary: A multichannel digital EEG recording measured by the international 10-20 system with electrodes applied with paste and impedances below 5000 ohms performed in our laboratory with EKG monitoring in an awake and drowsy patient.  Hyperventilation and photic stimulation were performed.  The digital EEG was referentially recorded, reformatted, and digitally filtered in a variety of bipolar and referential montages for optimal display.    Description: The patient is awake and drowsy during the recording.  During maximal wakefulness, there is a symmetric, medium voltage 9 Hz posterior dominant rhythm that attenuates with eye opening.  The record is symmetric.  There is an excess amount of diffuse low voltage beta activity seen throughout the recording. During drowsiness, there is a slight increase in theta slowing of the background. Hyperventilation and photic stimulation did not elicit any abnormalities.  There were no epileptiform discharges or electrographic seizures seen.    EKG lead was unremarkable.  Impression: This awake and drowsy EEG is normal except for excess amount of diffuse low voltage beta activity.  Clinical Correlation: Diffuse low voltage beta activity is commonly seen with sedating medications such as benzodiazepines. The absence of epileptiform discharges does not exclude a clinical diagnosis of epilepsy.  If further clinical questions remain, prolonged EEG may be helpful.  Clinical correlation is advised.   Patrcia DollyKaren Klaire Court, M.D.

## 2014-05-14 NOTE — Procedures (Signed)
ELECTROENCEPHALOGRAM REPORT  Dates of Recording: 05/01/2014 to 05/02/2014  Patient's Name: Sherry Tate MRN: [id number] Date of Birth: [birth year]  Referring Provider: Dr. Patrcia DollyKaren Aquino  Procedure: 24-hour ambulatory EEG  History: This is a 44 year old woman with new onset seizures  Medications: Lamictal, Xanax, Adderall, Seroquel, Saphris  Technical Summary: This is a 24-hour multichannel digital EEG recording measured by the international 10-20 system with electrodes applied with paste and impedances below 5000 ohms performed as portable with EKG monitoring.  The digital EEG was referentially recorded, reformatted, and digitally filtered in a variety of bipolar and referential montages for optimal display.    DESCRIPTION OF RECORDING: During maximal wakefulness, the background activity consisted of a symmetric 10-11 Hz posterior dominant rhythm which was reactive to eye opening. There is an excess amount of diffuse low voltage beta activity seen throughout the recording.  There were no epileptiform discharges or focal slowing seen in wakefulness.  During the recording, the patient progresses through wakefulness, drowsiness, and Stage 2 sleep. There were rare sharp waves seen over the left frontal region.  Events: On 02/03 at 1310 hours, she reports feeling dizzy and "off." Electrographically, there were no EEG changes seen. EKG shows heart rate of 132 bpm that goes down to 78 bpm after a few minutes.  On 02/03 at 1630 hours, she reports feeling dizzy and "off." She gets up 15 minutes later and reports feeling dizzier. Electrographically, there were no EEG changes seen. EKG shows heart rate of 126 bpm at 1645 hours.  There were no electrographic seizures seen.  EKG lead was unremarkable.  IMPRESSION: This 24-hour ambulatory EEG study is abnormal due to rare left frontal sharp waves seen exclusively in sleep.  CLINICAL CORRELATION of the above findings indicates possible  epileptogenic potential over the left frontal region. Excess beta activity is typically seen with benzodiazepine use. There were no electrographic seizures seen in this study.    Patrcia DollyKaren Aquino, M.D.

## 2014-05-16 ENCOUNTER — Ambulatory Visit (INDEPENDENT_AMBULATORY_CARE_PROVIDER_SITE_OTHER): Payer: Managed Care, Other (non HMO) | Admitting: Neurology

## 2014-05-16 ENCOUNTER — Encounter: Payer: Self-pay | Admitting: Neurology

## 2014-05-16 VITALS — BP 96/70 | HR 98 | Resp 16 | Ht 66.0 in | Wt 127.0 lb

## 2014-05-16 DIAGNOSIS — G40209 Localization-related (focal) (partial) symptomatic epilepsy and epileptic syndromes with complex partial seizures, not intractable, without status epilepticus: Secondary | ICD-10-CM

## 2014-05-16 NOTE — Patient Instructions (Signed)
1. Check Lamictal level 1 week after increasing dose to 200mg  2. Follow-up in 3 months  Seizure Precautions: 1. If medication has been prescribed for you to prevent seizures, take it exactly as directed.  Do not stop taking the medicine without talking to your doctor first, even if you have not had a seizure in a long time.   2. Avoid activities in which a seizure would cause danger to yourself or to others.  Don't operate dangerous machinery, swim alone, or climb in high or dangerous places, such as on ladders, roofs, or girders.  Do not drive unless your doctor says you may.  3. If you have any warning that you may have a seizure, lay down in a safe place where you can't hurt yourself.    4.  No driving for 6 months from last seizure, [place].   Please refer to the following link on the [employer]ile[employer] of [employer]'s website for more information: http://www.epilepsyfoundation.org/answerplace/Social/driving/drivingu.cfm   5.  Maintain good sleep hygiene.  6.  Notify your neurology if you are planning pregnancy or if you become pregnant.  7.  Contact your doctor if you have any problems that may be related to the medicine you are taking.  8.  Call 911 and bring the patient back to the ED if:        A.  The seizure lasts longer than 5 minutes.       B.  The patient doesn't awaken shortly after the seizure  C.  The patient has new problems such as difficulty seeing, speaking or moving  D.  The patient was injured during the seizure  E.  The patient has a temperature over 102 F (39C)  F.  The patient vomited and now is having trouble breathing

## 2014-05-16 NOTE — Progress Notes (Signed)
NEUROLOGY FOLLOW UP OFFICE NOTE  Sherry Tate [id number]  HISTORY OF PRESENT ILLNESS: I had the pleasure of seeing Sherry Tate in follow-up in the neurology clinic on 05/16/2014.  The patient was last seen 3 weeks ago for new onset seizures. She is again accompanied by her husband who helps supplement the history today.  Records and images were personally reviewed where available.  I personally reviewed MRI brain with and without contrast which did not show any acute changes. There was asymmetry noted in the temporal lobes, with temporal horn more prominent in the right than left. 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 Routine EEG showed excess beta activity from benzodiazepine use. Her 24-hour EEG had shown rare left frontal sharp waves exclusively in sleep.   Since her last visit, she denies any further seizures. She had been restarted on Lamictal for mood. She reports that she feels a bit "off" and has dizziness that comes and goes, occurring up to 10 times a day. She has found that squatting down would relieve the dizziness. She denies any gaps in time, no staring/unresponsiveness, olfactory/gustatory hallucinations, focal numbness/tingling/weakness.   HPI: This is a pleasant 44 yo RH woman with a history of bipolar disorder, in her usual state of health until around November/December 2015 when she had a nocturnal seizure witnessed by her husband. She was stiff and shaking for 20-30 seconds, then went back to sleep. She woke up with her muscles sore, no focal weakness, no tongue bite or incontinence. It appears she did nto seek medical attention until after another seizure on 04/12/2014 in the morning while she was doing spelling words with her daughter. She recalls saying they had to get going, then she suddenly fell back shaking and moaning. Her daughter  called her husband, and when she came to, he had come back home. Her daughter reports she was answering "no" to all the questions. She is amnestic of the event. She denies any prior warning symptoms. She denies any olfactory/gustatory hallucinations, deja vu, rising epigastric sensation, focal numbness/tingling/weakness, myoclonic jerks. She denies any alcohol or sleep deprivation prior to the seizures.   She had been taking Lamictal  qAM for 3-4 years but did not feel it was helping and tapered it off after discussing this with her psychiatrist. She had been completely off Lamictal for 2 months. She had asked to be restarted on Zoloft, which he had used for many years in the past. She had been taking Zoloft for 2 months when the seizure occurred. She takes Xanax XR  every morning since 2015, no change in dose, no missed doses. She has been taking Saphris for 3-4 years, which helps her sleep. For several years she has had mild 4/10 daily headaches that resolve after 30 minutes of taking Excedrin. There is no associated nausea, vomiting, photo/phonophobia, visual obscurations. She reports her memory is "horrible," she usually forgets what she went into a room for. She does not get lost driving, does not forget to take her medications.  Epilepsy Risk Factors: She had a normal birth and early development. There is no history of febrile convulsions, CNS infections such as meningitis/encephalitis, significant traumatic brain injury, neurosurgical procedures, or family history of seizures.  PAST MEDICAL HISTORY: Past Medical History  Diagnosis Date  . Bipolar disorder     MEDICATIONS: Current Outpatient Prescriptions on File Prior to Visit  Medication Sig Dispense Refill  . ALPRAZolam (XANAX XR) 3 MG 24 hr tablet Take 3 mg by mouth every morning.    Marland Kitchen amphetamine-dextroamphetamine (ADDERALL) 30 MG tablet Take 30 mg by mouth 2 (two) times daily.    Marland Kitchen asenapine (SAPHRIS) 5 MG SUBL 24 hr tablet  Place 5 mg under the tongue daily.    Marland Kitchen lamoTRIgine (LAMICTAL) 25 MG tablet Take 25 mg by mouth. Takes 5 tablets daily    . oxybutynin (DITROPAN) 5 MG tablet Take 5 mg by mouth. Take 2 tablets daily    . QUEtiapine (SEROQUEL) 50 MG tablet Take 50 mg by mouth. Takes 3 tablets daily    . silodosin (RAPAFLO) 8 MG CAPS capsule Take 8 mg by mouth daily with breakfast.     No current facility-administered medications on file prior to visit.    ALLERGIES: Allergies  Allergen Reactions  . Sulfa Antibiotics     jaundice    FAMILY HISTORY: No family history on file.  SOCIAL HISTORY: History   Social History  . Marital Status: Married    Spouse Name: N/A  . Number of Children: N/A  . Years of Education: N/A   Occupational History  . Not on file.   Social History Main Topics  . Smoking status: Former Games developer  . Smokeless tobacco: Never Used  . Alcohol Use: No  . Drug Use: No  . Sexual Activity: Yes   Other Topics Concern  . Not on file   Social History Narrative    REVIEW OF SYSTEMS: Constitutional: No fevers, chills, or sweats, no generalized fatigue, change in appetite Eyes: No visual changes, double vision, eye pain Ear, nose and throat: No hearing loss, ear pain, nasal congestion, sore throat Cardiovascular: No chest pain, palpitations Respiratory:  No shortness of breath at rest or with exertion, wheezes GastrointestinaI: No nausea, vomiting, diarrhea, abdominal pain, fecal incontinence Genitourinary:  No dysuria, urinary retention or frequency Musculoskeletal:  No neck pain,+ back pain Integumentary: No rash, pruritus, skin lesions Neurological: as above Psychiatric: No depression, insomnia, anxiety Endocrine: No palpitations, fatigue, diaphoresis, mood swings, change in appetite, change in weight, increased thirst Hematologic/Lymphatic:  No anemia, purpura, petechiae. Allergic/Immunologic: no itchy/runny eyes, nasal congestion, recent allergic reactions,  rashes  PHYSICAL EXAM: Filed Vitals:   05/16/14 0929  BP: 96/70  Pulse: 98  Resp: 16   General: No acute distress Head:  Normocephalic/atraumatic Neck: supple, no paraspinal tenderness, full range of motion Heart:  Regular rate and rhythm Lungs:  Clear to auscultation bilaterally Back: No paraspinal tenderness Skin/Extremities: No rash, no edema Neurological Exam: alert and oriented to person, place, and time. No aphasia or dysarthria. Fund of knowledge is appropriate.  Recent and remote memory are intact.  Attention and concentration are normal.    Able to name objects and repeat phrases. Cranial nerves: Pupils equal, round, reactive to light.  Fundoscopic exam unremarkable, no papilledema. Extraocular movements intact with no nystagmus. Visual fields full. Facial sensation intact. No facial asymmetry. Tongue, uvula, palate midline.  Motor: Bulk and tone normal, muscle strength 5/5 throughout with no pronator drift.  Sensation to light touch intact.  No extinction to double simultaneous stimulation.  Deep tendon reflexes 1+ throughout, toes downgoing.  Finger to nose testing intact.  Gait narrow-based and steady, able to tandem walk adequately.  Romberg negative.  IMPRESSION: This is a pleasant 44 yo RH woman with a history of bipolar disorder presenting with 2 generalized convulsions since 2015, last seizure 04/12/14. Her 24-hour EEG  had shown rare left frontal epileptiform discharges. MRI brain had shown hippocampal asymmetry, concerning for possible migrational abnormality in the left temporal lobe. 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 She is now on Lamictal and will increase to 200mg /day next week. A baseline Lamictal level will be checked a week after she increases dose. No further seizures or seizure-like symptoms. We again discussed avoidance of seizure triggers, as well as [place] driving laws that indicate that one should be seizure-free  for 6 months before returning to driving. She will follow-up in 3 months and knows to call our office for any changes.   Thank you for allowing me to participate in her care.  Please do not hesitate to call for any questions or concerns.  The duration of this appointment visit was 15 minutes of face-to-face time with the patient.  Greater than 50% of this time was spent in counseling, explanation of diagnosis, planning of further management, and coordination of care.   Patrcia DollyKaren Jameal Razzano, M.D.   CC: Dr. Clelia CroftShaw

## 2014-05-20 ENCOUNTER — Encounter: Payer: Self-pay | Admitting: Neurology

## 2014-05-31 ENCOUNTER — Ambulatory Visit: Payer: Managed Care, Other (non HMO) | Admitting: Neurology

## 2014-06-07 LAB — LAMOTRIGINE LEVEL: Lamotrigine Lvl: 5.1 ug/mL (ref 4.0–18.0)

## 2014-07-03 ENCOUNTER — Other Ambulatory Visit: Payer: Managed Care, Other (non HMO)

## 2014-08-07 ENCOUNTER — Encounter: Payer: Self-pay | Admitting: Neurology

## 2014-08-07 ENCOUNTER — Ambulatory Visit (INDEPENDENT_AMBULATORY_CARE_PROVIDER_SITE_OTHER): Payer: Managed Care, Other (non HMO) | Admitting: Neurology

## 2014-08-07 VITALS — BP 88/54 | HR 124 | Resp 16 | Ht 66.0 in | Wt 127.0 lb

## 2014-08-07 DIAGNOSIS — G40209 Localization-related (focal) (partial) symptomatic epilepsy and epileptic syndromes with complex partial seizures, not intractable, without status epilepticus: Secondary | ICD-10-CM | POA: Diagnosis not present

## 2014-08-07 NOTE — Progress Notes (Signed)
NEUROLOGY FOLLOW UP OFFICE NOTE  Sherry Tate [id number]  HISTORY OF PRESENT ILLNESS: I had the pleasure of seeing Sherry Tate in follow-up in the neurology clinic on 08/07/2014.  The patient was last seen 3 months ago for seizures. She is now on Lamictal 200mg /day which she had been taking previously for mood. Her Lamictal level was 5.1, however this was not a trough level. She denies any seizures or seizure-like symptoms since 04/12/14. She is tolerating Lamictal without side effects. She denies any gaps in time, staring/unresponsive episodes, focal numbness/tingling/weakness, olfactory/gustatory hallucinations. She feels briefly dizzy when she gets up in the morning and after stretching exercises feels better. Her BP today is low at 88/54, asymptomatic.   HPI: This is a pleasant 44 yo RH woman with a history of bipolar disorder, in her usual state of health until around November/December 2015 when she had a nocturnal seizure witnessed by her husband. She was stiff and shaking for 20-30 seconds, then went back to sleep. She woke up with her muscles sore, no focal weakness, no tongue bite or incontinence. It appears she did nto seek medical attention until after another seizure on 04/12/2014 in the morning while she was doing spelling words with her daughter. She recalls saying they had to get going, then she suddenly fell back shaking and moaning. Her daughter called her husband, and when she came to, he had come back home. Her daughter reports she was answering "no" to all the questions. She is amnestic of the event. She denies any prior warning symptoms. She denies any olfactory/gustatory hallucinations, deja vu, rising epigastric sensation, focal numbness/tingling/weakness, myoclonic jerks. She denies any alcohol or sleep deprivation prior to the seizures.   She had been taking Lamictal 200mg  qAM for 3-4 years but did not feel it was helping and tapered it off after discussing this with her  psychiatrist. She had been completely off Lamictal for 2 months. She had asked to be restarted on Zoloft, which he had used for many years in the past. She had been taking Zoloft for 2 months when the seizure occurred. She takes Xanax XR 3mg  every morning since 2015, no change in dose, no missed doses. She has been taking Saphris for 3-4 years, which helps her sleep. For several years she has had mild 4/10 daily headaches that resolve after 30 minutes of taking Excedrin. There is no associated nausea, vomiting, photo/phonophobia, visual obscurations. She reports her memory is "horrible," she usually forgets what she went into a room for. She does not get lost driving, does not forget to take her medications.  Epilepsy Risk Factors: She had a normal birth and early development. There is no history of febrile convulsions, CNS infections such as meningitis/encephalitis, significant traumatic brain injury, neurosurgical procedures, or family history of seizures.  Diagnostic Data: I personally reviewed MRI brain with and without contrast which did not show any acute changes. There was asymmetry noted in the temporal lobes, with temporal horn more prominent in the right than left. 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  Routine EEG showed excess beta activity from benzodiazepine use.  24-hour EEG had shown rare left frontal sharp waves exclusively in sleep.   PAST MEDICAL HISTORY: Past Medical History  Diagnosis Date  . Bipolar disorder     MEDICATIONS: Current Outpatient Prescriptions on File Prior to Visit  Medication Sig Dispense Refill  .  ALPRAZolam (XANAX XR) 3 MG 24 hr tablet Take 3 mg by mouth every morning.    Marland Kitchen amphetamine-dextroamphetamine (ADDERALL) 30 MG tablet Take 30 mg by mouth 2 (two) times daily.    Marland Kitchen asenapine (SAPHRIS) 5 MG SUBL 24 hr tablet Place 5  mg under the tongue daily.    Marland Kitchen oxybutynin (DITROPAN) 5 MG tablet Take 5 mg by mouth. Take 2 tablets daily    . QUEtiapine (SEROQUEL) 50 MG tablet Take 50 mg by mouth. Takes 3 tablets daily    . silodosin (RAPAFLO) 8 MG CAPS capsule Take 8 mg by mouth daily with breakfast.    Lamotrigine  daily No current facility-administered medications on file prior to visit.    ALLERGIES: Allergies  Allergen Reactions  . Sulfa Antibiotics     jaundice    FAMILY HISTORY: No family history on file.  SOCIAL HISTORY: History   Social History  . Marital Status: Married    Spouse Name: N/A  . Number of Children: N/A  . Years of Education: N/A   Occupational History  . Not on file.   Social History Main Topics  . Smoking status: Former Games developer  . Smokeless tobacco: Never Used  . Alcohol Use: No  . Drug Use: No  . Sexual Activity: Yes   Other Topics Concern  . Not on file   Social History Narrative    REVIEW OF SYSTEMS: Constitutional: No fevers, chills, or sweats, no generalized fatigue, change in appetite Eyes: No visual changes, double vision, eye pain Ear, nose and throat: No hearing loss, ear pain, nasal congestion, sore throat Cardiovascular: No chest pain, palpitations Respiratory:  No shortness of breath at rest or with exertion, wheezes GastrointestinaI: No nausea, vomiting, diarrhea, abdominal pain, fecal incontinence Genitourinary:  No dysuria, urinary retention or frequency Musculoskeletal:  No neck pain, back pain Integumentary: No rash, pruritus, skin lesions Neurological: as above Psychiatric: + depression, anxiety Endocrine: No palpitations, fatigue, diaphoresis, mood swings, change in appetite, change in weight, increased thirst Hematologic/Lymphatic:  No anemia, purpura, petechiae. Allergic/Immunologic: no itchy/runny eyes, nasal congestion, recent allergic reactions, rashes  PHYSICAL EXAM: Filed Vitals:   08/07/14 0825  BP: 88/54  Pulse:   Resp:     General: No acute distress Head:  Normocephalic/atraumatic Neck: supple, no paraspinal tenderness, full range of motion Heart:  Regular rate and rhythm Lungs:  Clear to auscultation bilaterally Back: No paraspinal tenderness Skin/Extremities: No rash, no edema Neurological Exam: alert and oriented to person, place, and time. No aphasia or dysarthria. Fund of knowledge is appropriate.  Recent and remote memory are intact.  Attention and concentration are normal.    Able to name objects and repeat phrases. Cranial nerves: Pupils equal, round, reactive to light.  Fundoscopic exam unremarkable, no papilledema. Extraocular movements intact with no nystagmus. Visual fields full. Facial sensation intact. No facial asymmetry. Tongue, uvula, palate midline.  Motor: Bulk and tone normal, muscle strength 5/5 throughout with no pronator drift.  Sensation to light touch, temperature and vibration intact.  No extinction to double simultaneous stimulation.  Deep tendon reflexes 1+ throughout, toes downgoing.  Finger to nose testing intact.  Gait narrow-based and steady, able to tandem walk adequately.  Romberg negative.  IMPRESSION: This is a pleasant 44 yo RH woman with a history of bipolar disorder presenting with 2 generalized convulsions since 2015, last seizure 04/12/14. Her 24-hour EEG had shown rare left frontal epileptiform discharges. MRI brain had shown hippocampal asymmetry, concerning for possible migrational abnormality in the  left temporal lobe. 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 She is now on Lamictal 200mg /day with no seizures since January. She denies any side effects. Lamictal level was not a trough level, a repeat trough level will be drawn to establish baseline level. She will discuss extended-release formulation with her psychiatrist since she takes the Lamictal once a day. She is aware of [place] driving laws that indicate that one should be  seizure-free for 6 months before returning to driving. She will follow-up in 4 months and knows to call our office for any changes.   Thank you for allowing me to participate in her care.  Please do not hesitate to call for any questions or concerns.  The duration of this appointment visit was 15 minutes of face-to-face time with the patient.  Greater than 50% of this time was spent in counseling, explanation of diagnosis, planning of further management, and coordination of care.   Patrcia DollyKaren Dossie Swor, M.D.   CC: Dr. Clelia CroftShaw

## 2014-08-07 NOTE — Patient Instructions (Signed)
1. Continue current dose of Lamictal 200mg  daily. Ask your psychiatrist how they feel about extended-release formulation 2. Follow-up in 4 months, call for any problems  Seizure Precautions: 1. If medication has been prescribed for you to prevent seizures, take it exactly as directed.  Do not stop taking the medicine without talking to your doctor first, even if you have not had a seizure in a long time.   2. Avoid activities in which a seizure would cause danger to yourself or to others.  Don't operate dangerous machinery, swim alone, or climb in high or dangerous places, such as on ladders, roofs, or girders.  Do not drive unless your doctor says you may.  3. If you have any warning that you may have a seizure, lay down in a safe place where you can't hurt yourself.    4.  No driving for 6 months from last seizure, [place] [facility].   Please refer to the following link on the [employer]ile[employer] Foundation of America's website for more information: http://www.epilepsyfoundation.org/answerplace/Social/driving/drivingu.cfm   5.  Maintain good sleep hygiene.  6.  Contact your doctor if you have any problems that may be related to the medicine you are taking.  7.  Call 911 and bring the patient back to the ED if:        A.  The seizure lasts longer than 5 minutes.       B.  The patient doesn't awaken shortly after the seizure  C.  The patient has new problems such as difficulty seeing, speaking or moving  D.  The patient was injured during the seizure  E.  The patient has a temperature over 102 F (39C)  F.  The patient vomited and now is having trouble breathing

## 2014-08-14 ENCOUNTER — Telehealth: Payer: Self-pay | Admitting: *Deleted

## 2014-08-14 NOTE — Telephone Encounter (Signed)
Returned patient's call. Let her know results haven't been finalized yet & she will get a call when we get results.

## 2014-08-14 NOTE — Telephone Encounter (Signed)
Patient following up on her lab results  Call back number [phone]

## 2014-08-17 LAB — LAMOTRIGINE LEVEL: Lamotrigine Lvl: 3.6 ug/mL — ABNORMAL LOW (ref 4.0–18.0)

## 2014-08-20 ENCOUNTER — Telehealth: Payer: Self-pay | Admitting: *Deleted

## 2014-08-20 NOTE — Telephone Encounter (Signed)
Lab was done on 5-17.  Please advise.

## 2014-08-20 NOTE — Telephone Encounter (Signed)
Patient notified

## 2014-08-20 NOTE — Telephone Encounter (Signed)
Patient checking on lab results Call back number [phone]

## 2014-08-20 NOTE — Telephone Encounter (Signed)
Lamictal level was borderline low, but it seems that she has been on Lamictal 200mg /d for sometime and been stable.  I'll discuss if there needs to be any medication changes with Dr. Karel JarvisAquino when she comes back next week.

## 2014-08-28 ENCOUNTER — Telehealth: Payer: Self-pay | Admitting: Neurology

## 2014-08-28 NOTE — Telephone Encounter (Signed)
Has she spoken to her psychiatrist about extended-release lamictal? I would switch to Lamictal XR 200mg  if they are okay with that. Thanks

## 2014-08-28 NOTE — Telephone Encounter (Signed)
Pt wants to talk to someone about her lamictal level. She states that it is to low please call [phone]

## 2014-08-28 NOTE — Telephone Encounter (Signed)
Patient notified of advisement. She is going to call her psychiatrist & ask about the XR. She is going to call back and let me know what they say.

## 2014-08-28 NOTE — Telephone Encounter (Signed)
See other phone note

## 2014-08-29 ENCOUNTER — Telehealth: Payer: Self-pay | Admitting: *Deleted

## 2014-08-29 MED ORDER — LAMOTRIGINE ER 200 MG PO TB24: 200.0000 mg | ORAL_TABLET | Freq: Every day | ORAL | Status: DC

## 2014-08-29 NOTE — Telephone Encounter (Signed)
Patent called she states her psychiatrist oked her to take 200mg  of Lamictal XR   Call back number [phone]

## 2014-08-29 NOTE — Telephone Encounter (Signed)
Returned patient's call. She has been cleared by her psychiatrist to take Lamictal XR 200 mg daily. New Rx sent to her pharmacy.

## 2014-10-28 ENCOUNTER — Other Ambulatory Visit: Payer: Self-pay | Admitting: Family Medicine

## 2014-10-28 MED ORDER — LAMOTRIGINE ER 200 MG PO TB24: 200.0000 mg | ORAL_TABLET | Freq: Every day | ORAL | Status: DC

## 2014-10-28 NOTE — Telephone Encounter (Signed)
Received faxed refill request from [facility] for Lamictal ER 200 mg

## 2014-12-09 ENCOUNTER — Encounter: Payer: Self-pay | Admitting: Neurology

## 2014-12-09 ENCOUNTER — Ambulatory Visit (INDEPENDENT_AMBULATORY_CARE_PROVIDER_SITE_OTHER): Payer: Managed Care, Other (non HMO) | Admitting: Neurology

## 2014-12-09 VITALS — BP 88/70 | HR 119 | Resp 16 | Ht 66.0 in | Wt 122.0 lb

## 2014-12-09 DIAGNOSIS — G40209 Localization-related (focal) (partial) symptomatic epilepsy and epileptic syndromes with complex partial seizures, not intractable, without status epilepticus: Secondary | ICD-10-CM

## 2014-12-09 MED ORDER — LAMOTRIGINE ER 200 MG PO TB24: 200.0000 mg | ORAL_TABLET | Freq: Every day | ORAL | Status: DC

## 2014-12-09 NOTE — Progress Notes (Signed)
NEUROLOGY FOLLOW UP OFFICE NOTE  Sherry Tate [id number]  HISTORY OF PRESENT ILLNESS: I had the pleasure of seeing Sherry Tate in follow-up in the neurology clinic on 12/09/2014.  The patient was last seen on 4 months ago for seizures. She is now on Lamictal XR /day which she had been taking previously for mood. Her trough Lamictal level in May was 3.6. She continues to do well with no seizures or seizure-like symptoms since 04/12/14. She is tolerating Lamictal without side effects. She denies any gaps in time, staring/unresponsive episodes, focal numbness/tingling/weakness, olfactory/gustatory hallucinations. She is again noted to have low BP today, 88/70, asymptomatic. She still has brief dizziness at times, no syncopal episodes. She reports she has been told low BP is due to her medications.   HPI: This is a pleasant 44 yo RH woman with a history of bipolar disorder, in her usual state of health until around November/December 2015 when she had a nocturnal seizure witnessed by her husband. She was stiff and shaking for 20-30 seconds, then went back to sleep. She woke up with her muscles sore, no focal weakness, no tongue bite or incontinence. It appears she did nto seek medical attention until after another seizure on 04/12/2014 in the morning while she was doing spelling words with her daughter. She recalls saying they had to get going, then she suddenly fell back shaking and moaning. Her daughter called her husband, and when she came to, he had come back home. Her daughter reports she was answering "no" to all the questions. She is amnestic of the event. She denies any prior warning symptoms. She denies any olfactory/gustatory hallucinations, deja vu, rising epigastric sensation, focal numbness/tingling/weakness, myoclonic jerks. She denies any alcohol or sleep deprivation prior to the seizures.   She had been taking Lamictal  qAM for 3-4 years but did not feel it was helping and  tapered it off after discussing this with her psychiatrist. She had been completely off Lamictal for 2 months. She had asked to be restarted on Zoloft, which he had used for many years in the past. She had been taking Zoloft for 2 months when the seizure occurred. She takes Xanax XR  every morning since 2015, no change in dose, no missed doses. She has been taking Saphris for 3-4 years, which helps her sleep. For several years she has had mild 4/10 daily headaches that resolve after 30 minutes of taking Excedrin. There is no associated nausea, vomiting, photo/phonophobia, visual obscurations. She reports her memory is "horrible," she usually forgets what she went into a room for. She does not get lost driving, does not forget to take her medications.  Epilepsy Risk Factors: She had a normal birth and early development. There is no history of febrile convulsions, CNS infections such as meningitis/encephalitis, significant traumatic brain injury, neurosurgical procedures, or family history of seizures.  Diagnostic Data: I personally reviewed MRI brain with and without contrast which did not show any acute changes. There was asymmetry noted in the temporal lobes, with temporal horn more prominent in the right than left. 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  Routine EEG showed excess beta activity from benzodiazepine use.  24-hour EEG had shown rare left frontal sharp waves exclusively in sleep.   PAST MEDICAL HISTORY: Past Medical History  Diagnosis Date  . Bipolar disorder     MEDICATIONS: Current Outpatient  Prescriptions on File Prior to Visit  Medication Sig Dispense Refill  . ALPRAZolam (XANAX XR) 3 MG 24 hr tablet Take 3 mg by mouth every morning.    Marland Kitchen asenapine (SAPHRIS) 5 MG SUBL 24 hr tablet Place 5 mg under the tongue daily.    . LamoTRIgine XR 200 MG  TB24 Take 1 tablet (200 mg total) by mouth daily. 90 tablet 1  . oxybutynin (DITROPAN) 5 MG tablet Take 5 mg by mouth. Take 2 tablets daily    . QUEtiapine (SEROQUEL) 50 MG tablet Take 50 mg by mouth. Takes 3 tablets daily    . silodosin (RAPAFLO) 8 MG CAPS capsule Take 8 mg by mouth daily with breakfast.     No current facility-administered medications on file prior to visit.    ALLERGIES: Allergies  Allergen Reactions  . Sulfa Antibiotics     jaundice    FAMILY HISTORY: No family history on file.  SOCIAL HISTORY: Social History   Social History  . Marital Status: Married    Spouse Name: N/A  . Number of Children: N/A  . Years of Education: N/A   Occupational History  . Not on file.   Social History Main Topics  . Smoking status: Former Games developer  . Smokeless tobacco: Never Used  . Alcohol Use: No  . Drug Use: No  . Sexual Activity: Yes   Other Topics Concern  . Not on file   Social History Narrative    REVIEW OF SYSTEMS: Constitutional: No fevers, chills, or sweats, no generalized fatigue, change in appetite Eyes: No visual changes, double vision, eye pain Ear, nose and throat: No hearing loss, ear pain, nasal congestion, sore throat Cardiovascular: No chest pain, palpitations Respiratory:  No shortness of breath at rest or with exertion, wheezes GastrointestinaI: No nausea, vomiting, diarrhea, abdominal pain, fecal incontinence Genitourinary:  No dysuria, urinary retention or frequency Musculoskeletal:  No neck pain, back pain Integumentary: No rash, pruritus, skin lesions Neurological: as above Psychiatric: No depression, insomnia, anxiety Endocrine: No palpitations, fatigue, diaphoresis, mood swings, change in appetite, change in weight, increased thirst Hematologic/Lymphatic:  No anemia, purpura, petechiae. Allergic/Immunologic: no itchy/runny eyes, nasal congestion, recent allergic reactions, rashes  PHYSICAL EXAM: Filed Vitals:   12/09/14 0823  BP:  88/70  Pulse: 119  Resp: 16   General: No acute distress Head:  Normocephalic/atraumatic Neck: supple, no paraspinal tenderness, full range of motion Heart:  Regular rate and rhythm Lungs:  Clear to auscultation bilaterally Back: No paraspinal tenderness Skin/Extremities: No rash, no edema Neurological Exam: alert and oriented to person, place, and time. No aphasia or dysarthria. Fund of knowledge is appropriate.  Recent and remote memory are intact. 3/3 delayed recall. Attention and concentration are normal.    Able to name objects and repeat phrases. Cranial nerves: Pupils equal, round, reactive to light.  Fundoscopic exam unremarkable, no papilledema. Extraocular movements intact with no nystagmus. Visual fields full. Facial sensation intact. No facial asymmetry. Tongue, uvula, palate midline.  Motor: Bulk and tone normal, muscle strength 5/5 throughout with no pronator drift.  Sensation to light touch intact.  No extinction to double simultaneous stimulation.  Deep tendon reflexes +1 throughout, toes downgoing.  Finger to nose testing intact.  Gait narrow-based and steady, able to tandem walk adequately.  Romberg negative.  IMPRESSION: This is a pleasant 44 yo RH woman with a history of bipolar disorder presenting with 2 generalized convulsions since 2015, last seizure 04/12/14. Her 24-hour EEG had shown rare left frontal epileptiform discharges.  MRI brain had shown hippocampal asymmetry, concerning for possible migrational abnormality in the left temporal lobe. 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 She continues on Lamictal XR 200mg  daily with no seizures or side effects. She is back to driving and understands [place] driving laws to stop driving after a seizure until seizure-free for 6 months. Refills for Lamictal sent today. She will continue to monitor BP and discuss this with her PCP. She will follow-up in 6 months and knows to call our office  for any changes.   Thank you for allowing me to participate in her care.  Please do not hesitate to call for any questions or concerns.  The duration of this appointment visit was 24 minutes of face-to-face time with the patient.  Greater than 50% of this time was spent in counseling, explanation of diagnosis, planning of further management, and coordination of care.   Patrcia Dolly, M.D.   CC: Dr. Clelia Croft

## 2014-12-09 NOTE — Patient Instructions (Signed)
1. Continue Lamictal XR  daily 2. Discuss BP with PCP, continue to monitor 3. Follow-up in 6 months, call for any changes  Seizure Precautions: 1. If medication has been prescribed for you to prevent seizures, take it exactly as directed.  Do not stop taking the medicine without talking to your doctor first, even if you have not had a seizure in a long time.   2. Avoid activities in which a seizure would cause danger to yourself or to others.  Don't operate dangerous machinery, swim alone, or climb in high or dangerous places, such as on ladders, roofs, or girders.  Do not drive unless your doctor says you may.  3. If you have any warning that you may have a seizure, lay down in a safe place where you can't hurt yourself.    4.  No driving for 6 months from last seizure, as per [facility].   Please refer to the following link on the Epilepsy Foundation of America's website for more information: http://www.epilepsyfoundation.org/answerplace/Social/driving/drivingu.cfm   5.  Maintain good sleep hygiene.  6.  Notify your neurology if you are planning pregnancy or if you become pregnant.  7.  Contact your doctor if you have any problems that may be related to the medicine you are taking.  8.  Call 911 and bring the patient back to the ED if:        A.  The seizure lasts longer than 5 minutes.       B.  The patient doesn't awaken shortly after the seizure  C.  The patient has new problems such as difficulty seeing, speaking or moving  D.  The patient was injured during the seizure  E.  The patient has a temperature over 102 F (39C)  F.  The patient vomited and now is having trouble breathing

## 2015-06-09 ENCOUNTER — Ambulatory Visit (INDEPENDENT_AMBULATORY_CARE_PROVIDER_SITE_OTHER): Payer: Managed Care, Other (non HMO) | Admitting: Neurology

## 2015-06-09 ENCOUNTER — Encounter: Payer: Self-pay | Admitting: Neurology

## 2015-06-09 VITALS — BP 130/78 | HR 92 | Ht 66.0 in | Wt 123.0 lb

## 2015-06-09 DIAGNOSIS — G40209 Localization-related (focal) (partial) symptomatic epilepsy and epileptic syndromes with complex partial seizures, not intractable, without status epilepticus: Secondary | ICD-10-CM

## 2015-06-09 DIAGNOSIS — G2581 Restless legs syndrome: Secondary | ICD-10-CM

## 2015-06-09 MED ORDER — PRAMIPEXOLE DIHYDROCHLORIDE 0.125 MG PO TABS: ORAL_TABLET | ORAL | Status: DC

## 2015-06-09 MED ORDER — LAMOTRIGINE ER 200 MG PO TB24: 200.0000 mg | ORAL_TABLET | Freq: Every day | ORAL | Status: DC

## 2015-06-09 NOTE — Patient Instructions (Signed)
1. Continue Lamictal XR 200mg  daily 2. Start Mirapex 0.125mg  in the morning for restless leg syndrome. Continue iron supplements. Monitor for drowsiness, call in a month to update on how you are feeling 3. Follow-up in 6 months, call for any changes  Seizure Precautions: 1. If medication has been prescribed for you to prevent seizures, take it exactly as directed.  Do not stop taking the medicine without talking to your doctor first, even if you have not had a seizure in a long time.   2. Avoid activities in which a seizure would cause danger to yourself or to others.  Don't operate dangerous machinery, swim alone, or climb in high or dangerous places, such as on ladders, roofs, or girders.  Do not drive unless your doctor says you may.  3. If you have any warning that you may have a seizure, lay down in a safe place where you can't hurt yourself.    4.  No driving for 6 months from last seizure, as [place] [facility] [place].   Please refer to the following link on the Epilepsy Foundation of America's website for more information: http://www.epilepsyfoundation.org/answerplace/Social/driving/drivingu.cfm   5.  Maintain good sleep hygiene. Avoid alcohol.  6.  Notify your neurology if you are planning pregnancy or if you become pregnant.  7.  Contact your doctor if you have any problems that may be related to the medicine you are taking.  8.  Call 911 and bring the patient back to the ED if:        A.  The seizure lasts longer than 5 minutes.       B.  The patient doesn't awaken shortly after the seizure  C.  The patient has new problems such as difficulty seeing, speaking or moving  D.  The patient was injured during the seizure  E.  The patient has a temperature over 102 F (39C)  F.  The patient vomited and now is having trouble breathing

## 2015-06-09 NOTE — Progress Notes (Signed)
NEUROLOGY FOLLOW UP OFFICE NOTE  Sherry Tate [id number]  HISTORY OF PRESENT ILLNESS: I had the pleasure of seeing Sherry Tate in follow-up in the neurology clinic on 06/09/2015. The patient was last seen on 6 months ago for seizures. She continues on Lamictal XR /day with no seizures. She had been taking this previously for mood. She continues to do well with no seizures or seizure-like symptoms since 04/12/14. She is tolerating Lamictal without side effects. She denies any gaps in time, staring/unresponsive episodes, focal numbness/tingling/weakness, olfactory/gustatory hallucinations. Her main concern today is symptoms in her legs suggestive of restless leg syndrome. They occur when she wakes up in the morning, she has a tingling in both legs and has the urge to move them, better when she walks around. They last around 3 hours, no associated pain or weakness. She reports iron level was checked by her PCP and she is now on iron supplements, with no change in symptoms. No recent changes in medications.  HPI: This is a pleasant 45 yo RH woman with a history of bipolar disorder, in her usual state of health until around November/December 2015 when she had a nocturnal seizure witnessed by her husband. She was stiff and shaking for 20-30 seconds, then went back to sleep. She woke up with her muscles sore, no focal weakness, no tongue bite or incontinence. It appears she did nto seek medical attention until after another seizure on 04/12/2014 in the morning while she was doing spelling words with her daughter. She recalls saying they had to get going, then she suddenly fell back shaking and moaning. Her daughter called her husband, and when she came to, he had come back home. Her daughter reports she was answering "no" to all the questions. She is amnestic of the event. She denies any prior warning symptoms. She denies any olfactory/gustatory hallucinations, deja vu, rising epigastric sensation,  focal numbness/tingling/weakness, myoclonic jerks. She denies any alcohol or sleep deprivation prior to the seizures.   She had been taking Lamictal  qAM for 3-4 years but did not feel it was helping and tapered it off after discussing this with her psychiatrist. She had been completely off Lamictal for 2 months. She had asked to be restarted on Zoloft, which he had used for many years in the past. She had been taking Zoloft for 2 months when the seizure occurred. She takes Xanax XR  every morning since 2015, no change in dose, no missed doses. She has been taking Saphris for 3-4 years, which helps her sleep. For several years she has had mild 4/10 daily headaches that resolve after 30 minutes of taking Excedrin. There is no associated nausea, vomiting, photo/phonophobia, visual obscurations. She reports her memory is "horrible," she usually forgets what she went into a room for. She does not get lost driving, does not forget to take her medications.  Epilepsy Risk Factors: She had a normal birth and early development. There is no history of febrile convulsions, CNS infections such as meningitis/encephalitis, significant traumatic brain injury, neurosurgical procedures, or family history of seizures.  Diagnostic Data: I personally reviewed MRI brain with and without contrast which did not show any acute changes. There was asymmetry noted in the temporal lobes, with temporal horn more prominent in the right than left. 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  Routine EEG showed excess beta activity from benzodiazepine  use.  24-hour EEG had shown rare left frontal sharp waves exclusively in sleep.   Her trough Lamictal level in May 2016 was 3.6.  PAST MEDICAL HISTORY: Past Medical History  Diagnosis Date  . Bipolar disorder [facility])     MEDICATIONS: Current  Outpatient Prescriptions on File Prior to Visit  Medication Sig Dispense Refill  . ADDERALL XR 30 MG 24 hr capsule 30 mg. Take 2 capsules every morning  0  . ALPRAZolam (XANAX XR) 3 MG 24 hr tablet Take 3 mg by mouth every morning.    Marland Kitchen asenapine (SAPHRIS) 5 MG SUBL 24 hr tablet Place 5 mg under the tongue daily.    . LamoTRIgine XR 200 MG TB24 Take 1 tablet (200 mg total) by mouth daily. 90 tablet 3  . oxybutynin (DITROPAN) 5 MG tablet Take 5 mg by mouth. Take 2 tablets daily    . QUEtiapine (SEROQUEL) 50 MG tablet Take 50 mg by mouth. Takes 3 tablets daily     No current facility-administered medications on file prior to visit.    ALLERGIES: Allergies  Allergen Reactions  . Sulfa Antibiotics     jaundice    FAMILY HISTORY: No family history on file.  SOCIAL HISTORY: Social History   Social History  . Marital Status: Married    Spouse Name: N/A  . Number of Children: N/A  . Years of Education: N/A   Occupational History  . Not on file.   Social History Main Topics  . Smoking status: Former Games developer  . Smokeless tobacco: Never Used  . Alcohol Use: No  . Drug Use: No  . Sexual Activity: Yes   Other Topics Concern  . Not on file   Social History Narrative    REVIEW OF SYSTEMS: Constitutional: No fevers, chills, or sweats, no generalized fatigue, change in appetite Eyes: No visual changes, double vision, eye pain Ear, nose and throat: No hearing loss, ear pain, nasal congestion, sore throat Cardiovascular: No chest pain, palpitations Respiratory:  No shortness of breath at rest or with exertion, wheezes GastrointestinaI: No nausea, vomiting, diarrhea, abdominal pain, fecal incontinence Genitourinary:  No dysuria, urinary retention or frequency Musculoskeletal:  No neck pain, back pain Integumentary: No rash, pruritus, skin lesions Neurological: as above Psychiatric: No depression, insomnia, anxiety Endocrine: No palpitations, fatigue, diaphoresis, mood swings,  change in appetite, change in weight, increased thirst Hematologic/Lymphatic:  No anemia, purpura, petechiae. Allergic/Immunologic: no itchy/runny eyes, nasal congestion, recent allergic reactions, rashes  PHYSICAL EXAM: Filed Vitals:   06/09/15 0815  BP: 130/78  Pulse: 92   General: No acute distress Head:  Normocephalic/atraumatic Neck: supple, no paraspinal tenderness, full range of motion Heart:  Regular rate and rhythm Lungs:  Clear to auscultation bilaterally Back: No paraspinal tenderness Skin/Extremities: No rash, no edema Neurological Exam: alert and oriented to person, place, and time. No aphasia or dysarthria. Fund of knowledge is appropriate.  Recent and remote memory are intact. Attention and concentration are normal.    Able to name objects and repeat phrases. Cranial nerves: Pupils equal, round, reactive to light. Extraocular movements intact with no nystagmus. Visual fields full. Facial sensation intact. No facial asymmetry. Tongue, uvula, palate midline.  Motor: Bulk and tone normal, muscle strength 5/5 throughout with no pronator drift.  Sensation to light touch intact.  No extinction to double simultaneous stimulation.  Deep tendon reflexes +1 throughout, toes downgoing.  Finger to nose testing intact.  Gait narrow-based and steady, able to tandem walk adequately.  Romberg negative.  IMPRESSION: This is a pleasant 45 yo RH woman with a history of bipolar disorder presenting with 2 generalized convulsions since 2015, last seizure 04/12/14. Her 24-hour EEG had shown rare left frontal epileptiform discharges. MRI brain had shown hippocampal asymmetry, concerning for possible migrational abnormality in the left temporal lobe. 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 She continues on Lamictal XR 200mg  daily with no seizures or side effects. Her main concern today are symptoms suggestive of RLS. She will try a low dose of  Mirapex 0.125mg  in the morning (symptoms occur upon awakening). Side effects were discussed, including impulse control disorder, particularly with her history of bipolar disorder. She will continue to monitor symptoms and will call our office in a month to update on how she is doing. She is aware of [place] driving laws to stop driving after a seizure until 6 months seizure-free, she is back to driving. She will follow-up in 6 months and knows to call our office for any changes.   Thank you for allowing me to participate in her care.  Please do not hesitate to call for any questions or concerns.  The duration of this appointment visit was 25 minutes of face-to-face time with the patient.  Greater than 50% of this time was spent in counseling, explanation of diagnosis, planning of further management, and coordination of care.   Patrcia DollyKaren Aquino, M.D.   CC: Dr. Clelia CroftShaw

## 2015-12-10 ENCOUNTER — Ambulatory Visit (INDEPENDENT_AMBULATORY_CARE_PROVIDER_SITE_OTHER): Payer: Managed Care, Other (non HMO) | Admitting: Neurology

## 2015-12-10 ENCOUNTER — Encounter: Payer: Self-pay | Admitting: Neurology

## 2015-12-10 VITALS — BP 108/60 | HR 96 | Temp 98.2°F | Ht 66.0 in | Wt 121.5 lb

## 2015-12-10 DIAGNOSIS — R519 Headache, unspecified: Secondary | ICD-10-CM | POA: Insufficient documentation

## 2015-12-10 DIAGNOSIS — R51 Headache: Secondary | ICD-10-CM

## 2015-12-10 DIAGNOSIS — G40209 Localization-related (focal) (partial) symptomatic epilepsy and epileptic syndromes with complex partial seizures, not intractable, without status epilepticus: Secondary | ICD-10-CM | POA: Diagnosis not present

## 2015-12-10 DIAGNOSIS — G44209 Tension-type headache, unspecified, not intractable: Secondary | ICD-10-CM | POA: Diagnosis not present

## 2015-12-10 NOTE — Progress Notes (Signed)
NEUROLOGY FOLLOW UP OFFICE NOTE  Sherry Tate [id number]  HISTORY OF PRESENT ILLNESS: I had the pleasure of seeing Sherry Tate in follow-up in the neurology clinic on 12/10/2015. The patient was last seen on 6 months ago for seizures. She continues to do well on Lamictal XR 200mg /day with no seizures since 04/12/14, no side effects. She denies any gaps in time, staring/unresponsive episodes, focal numbness/tingling/weakness, olfactory/gustatory hallucinations. On her last visit, she was reporting symptoms suggestive of RLS. She was given a prescription for Mirapex but did not take it. She has symptoms mostly in the morning, and has found that stretching her legs helps some. She is also reporting mild daily headaches at night, she takes 2 Excedrin migraine which helps, then takes Seroquel and goes to sleep. She describes the headaches are dull, voer the frontal region, with no associated nausea/vomiting/photo/phonophobia. She denies any dizziness, vision changes, focal numbness/tingling/weakness, no falls.  HPI: This is a pleasant 45 yo RH woman with a history of bipolar disorder, in her usual state of health until around November/December 2015 when she had a nocturnal seizure witnessed by her husband. She was stiff and shaking for 20-30 seconds, then went back to sleep. She woke up with her muscles sore, no focal weakness, no tongue bite or incontinence. It appears she did nto seek medical attention until after another seizure on 04/12/2014 in the morning while she was doing spelling words with her daughter. She recalls saying they had to get going, then she suddenly fell back shaking and moaning. Her daughter called her husband, and when she came to, he had come back home. Her daughter reports she was answering "no" to all the questions. She is amnestic of the event. She denies any prior warning symptoms. She denies any olfactory/gustatory hallucinations, deja vu, rising epigastric sensation,  focal numbness/tingling/weakness, myoclonic jerks. She denies any alcohol or sleep deprivation prior to the seizures.   She had been taking Lamictal 200mg  qAM for 3-4 years but did not feel it was helping and tapered it off after discussing this with her psychiatrist. She had been completely off Lamictal for 2 months. She had asked to be restarted on Zoloft, which he had used for many years in the past. She had been taking Zoloft for 2 months when the seizure occurred. She takes Xanax XR 3mg  every morning since 2015, no change in dose, no missed doses. She has been taking Saphris for 3-4 years, which helps her sleep. For several years she has had mild 4/10 daily headaches that resolve after 30 minutes of taking Excedrin. There is no associated nausea, vomiting, photo/phonophobia, visual obscurations. She reports her memory is "horrible," she usually forgets what she went into a room for. She does not get lost driving, does not forget to take her medications.  Epilepsy Risk Factors: She had a normal birth and early development. There is no history of febrile convulsions, CNS infections such as meningitis/encephalitis, significant traumatic brain injury, neurosurgical procedures, or family history of seizures.  Diagnostic Data: I personally reviewed MRI brain with and without contrast which did not show any acute changes. There was asymmetry noted in the temporal lobes, with temporal horn more prominent in the right than left. 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  Routine EEG showed excess beta activity from benzodiazepine use.  24-hour EEG had shown rare left frontal sharp waves exclusively  in sleep.   Her trough Lamictal level in May 2016 was 3.6.  PAST MEDICAL HISTORY: Past Medical History:  Diagnosis Date  . Bipolar disorder [facility])     MEDICATIONS: Current  Outpatient Prescriptions on File Prior to Visit  Medication Sig Dispense Refill  . ADDERALL XR 30 MG 24 hr capsule 30 mg. Take 2 capsules every morning  0  . ALPRAZolam (XANAX XR) 3 MG 24 hr tablet Take 3 mg by mouth every morning.    Marland Kitchen asenapine (SAPHRIS) 5 MG SUBL 24 hr tablet Place 5 mg under the tongue daily.    . LamoTRIgine XR 200 MG TB24 Take 1 tablet (200 mg total) by mouth daily. 90 tablet 3  . oxybutynin (DITROPAN) 5 MG tablet Take 5 mg by mouth. Take 2 tablets daily    . POLY-IRON 150 150 MG capsule TK 1 C PO D  6  . pramipexole (MIRAPEX) 0.125 MG tablet Take 1 tablet in the morning 30 tablet 6  . QUEtiapine (SEROQUEL) 50 MG tablet Take 50 mg by mouth. Takes 3 tablets daily    . terbinafine (LAMISIL) 250 MG tablet TK 1 T PO D  3   No current facility-administered medications on file prior to visit.     ALLERGIES: Allergies  Allergen Reactions  . Sulfa Antibiotics     jaundice    FAMILY HISTORY: No family history on file.  SOCIAL HISTORY: Social History   Social History  . Marital status: Married    Spouse name: N/A  . Number of children: N/A  . Years of education: N/A   Occupational History  . Not on file.   Social History Main Topics  . Smoking status: Former Games developer  . Smokeless tobacco: Never Used  . Alcohol use No  . Drug use: No  . Sexual activity: Yes   Other Topics Concern  . Not on file   Social History Narrative  . No narrative on file    REVIEW OF SYSTEMS: Constitutional: No fevers, chills, or sweats, no generalized fatigue, change in appetite Eyes: No visual changes, double vision, eye pain Ear, nose and throat: No hearing loss, ear pain, nasal congestion, sore throat Cardiovascular: No chest pain, palpitations Respiratory:  No shortness of breath at rest or with exertion, wheezes GastrointestinaI: No nausea, vomiting, diarrhea, abdominal pain, fecal incontinence Genitourinary:  No dysuria, urinary retention or frequency Musculoskeletal:   No neck pain, back pain Integumentary: No rash, pruritus, skin lesions Neurological: as above Psychiatric: No depression, insomnia, anxiety Endocrine: No palpitations, fatigue, diaphoresis, mood swings, change in appetite, change in weight, increased thirst Hematologic/Lymphatic:  No anemia, purpura, petechiae. Allergic/Immunologic: no itchy/runny eyes, nasal congestion, recent allergic reactions, rashes  PHYSICAL EXAM: Vitals:   12/10/15 0853  BP: 108/60  Pulse: 96  Temp: 98.2 F (36.8 C)   General: No acute distress Head:  Normocephalic/atraumatic Neck: supple, no paraspinal tenderness, full range of motion Heart:  Regular rate and rhythm Lungs:  Clear to auscultation bilaterally Back: No paraspinal tenderness Skin/Extremities: No rash, no edema Neurological Exam: alert and oriented to person, place, and time. No aphasia or dysarthria. Fund of knowledge is appropriate.  Recent and remote memory are intact. Attention and concentration are normal.    Able to name objects and repeat phrases. Cranial nerves: Pupils equal, round, reactive to light. Fundoscopy exam shows sharp discs bilaterally. Extraocular movements intact with no nystagmus. Visual fields full. Facial sensation intact. No facial asymmetry. Tongue, uvula, palate midline.  Motor: Bulk and tone  normal, muscle strength 5/5 throughout with no pronator drift.  Sensation to light touch intact.  No extinction to double simultaneous stimulation.  Deep tendon reflexes +1 throughout, toes downgoing.  Finger to nose testing intact.  Gait narrow-based and steady, able to tandem walk adequately.  Romberg negative.  IMPRESSION: This is a pleasant 45 yo RH woman with a history of bipolar disorder who had 2 generalized convulsions since 2015, last seizure 04/12/14. Her 24-hour EEG had shown rare left frontal epileptiform discharges. MRI brain had shown hippocampal asymmetry, concerning for possible migrational abnormality in the left temporal  lobe. 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 She continues on Lamictal XR 200mg  daily with no seizures or side effects. She did not start Mirapex for RLS, and will try tonic water instead. She reports daily headaches suggestive of tension-type headaches, likely with component of medication overuse with daily Excedrin intake. We discussed rebound headaches, she will start minimizing rescue medication to 2-3 times a week and keep a headache calendar. If headaches continue, we will plan to start a daily headache preventative medication on her next visit, potentially Topamax. She is aware of [place] driving laws to stop driving after a seizure until 6 months seizure-free, she is back to driving. She will follow-up in 3 months and knows to call our office for any changes.   Thank you for allowing me to participate in her care.  Please do not hesitate to call for any questions or concerns.  The duration of this appointment visit was 25 minutes of face-to-face time with the patient.  Greater than 50% of this time was spent in counseling, explanation of diagnosis, planning of further management, and coordination of care.   Patrcia Dolly, M.D.   CC: Dr. Clelia Croft

## 2015-12-10 NOTE — Patient Instructions (Signed)
1. Continue all your medication 2. Minimize Excedrin intake to 2-3 times a week, keep a calendar of your headaches 3. Follow-up in 3 months, call for any changes  Seizure Precautions: 1. If medication has been prescribed for you to prevent seizures, take it exactly as directed.  Do not stop taking the medicine without talking to your doctor first, even if you have not had a seizure in a long time.   2. Avoid activities in which a seizure would cause danger to yourself or to others.  Don't operate dangerous machinery, swim alone, or climb in high or dangerous places, such as on ladders, roofs, or girders.  Do not drive unless your doctor says you may.  3. If you have any warning that you may have a seizure, lay down in a safe place where you can't hurt yourself.    4.  No driving for 6 months from last seizure, as [place] [facility] [place].   Please refer to the following link on the Epile[employer] Foundation of [employer]'s website for more information: http://www.epilepsyfoundation.org/answerplace/Social/driving/drivingu.cfm   5.  Maintain good sleep hygiene. Avoid alcohol  6.  Notify your neurology if you are planning pregnancy or if you become pregnant.  7.  Contact your doctor if you have any problems that may be related to the medicine you are taking.  8.  Call 911 and bring the patient back to the ED if:        A.  The seizure lasts longer than 5 minutes.       B.  The patient doesn't awaken shortly after the seizure  C.  The patient has new problems such as difficulty seeing, speaking or moving  D.  The patient was injured during the seizure  E.  The patient has a temperature over 102 F (39C)  F.  The patient vomited and now is having trouble breathing

## 2016-02-23 ENCOUNTER — Other Ambulatory Visit: Payer: Self-pay | Admitting: Neurology

## 2016-02-23 DIAGNOSIS — G40209 Localization-related (focal) (partial) symptomatic epilepsy and epileptic syndromes with complex partial seizures, not intractable, without status epilepticus: Secondary | ICD-10-CM

## 2016-02-23 NOTE — Telephone Encounter (Signed)
Refill sent to pharmacy per last OV note patient to continue.

## 2016-04-21 DIAGNOSIS — F3174 Bipolar disorder, in full remission, most recent episode manic: Secondary | ICD-10-CM | POA: Diagnosis not present

## 2016-04-21 DIAGNOSIS — F900 Attention-deficit hyperactivity disorder, predominantly inattentive type: Secondary | ICD-10-CM | POA: Diagnosis not present

## 2016-04-21 DIAGNOSIS — F3176 Bipolar disorder, in full remission, most recent episode depressed: Secondary | ICD-10-CM | POA: Diagnosis not present

## 2016-05-18 DIAGNOSIS — N920 Excessive and frequent menstruation with regular cycle: Secondary | ICD-10-CM | POA: Diagnosis not present

## 2016-05-18 DIAGNOSIS — Z3202 Encounter for pregnancy test, result negative: Secondary | ICD-10-CM | POA: Diagnosis not present

## 2016-06-01 DIAGNOSIS — F3111 Bipolar disorder, current episode manic without psychotic features, mild: Secondary | ICD-10-CM | POA: Diagnosis not present

## 2016-06-01 DIAGNOSIS — F410 Panic disorder [episodic paroxysmal anxiety] without agoraphobia: Secondary | ICD-10-CM | POA: Diagnosis not present

## 2016-06-01 DIAGNOSIS — F3131 Bipolar disorder, current episode depressed, mild: Secondary | ICD-10-CM | POA: Diagnosis not present

## 2016-07-01 DIAGNOSIS — R351 Nocturia: Secondary | ICD-10-CM | POA: Diagnosis not present

## 2016-07-01 DIAGNOSIS — R339 Retention of urine, unspecified: Secondary | ICD-10-CM | POA: Diagnosis not present

## 2016-08-03 DIAGNOSIS — F3176 Bipolar disorder, in full remission, most recent episode depressed: Secondary | ICD-10-CM | POA: Diagnosis not present

## 2016-08-03 DIAGNOSIS — F3174 Bipolar disorder, in full remission, most recent episode manic: Secondary | ICD-10-CM | POA: Diagnosis not present

## 2016-08-03 DIAGNOSIS — F900 Attention-deficit hyperactivity disorder, predominantly inattentive type: Secondary | ICD-10-CM | POA: Diagnosis not present

## 2016-08-18 DIAGNOSIS — R350 Frequency of micturition: Secondary | ICD-10-CM | POA: Diagnosis not present

## 2016-08-18 DIAGNOSIS — R351 Nocturia: Secondary | ICD-10-CM | POA: Diagnosis not present

## 2016-09-27 DIAGNOSIS — R634 Abnormal weight loss: Secondary | ICD-10-CM | POA: Diagnosis not present

## 2016-09-27 DIAGNOSIS — R630 Anorexia: Secondary | ICD-10-CM | POA: Diagnosis not present

## 2016-09-27 DIAGNOSIS — F319 Bipolar disorder, unspecified: Secondary | ICD-10-CM | POA: Diagnosis not present

## 2016-09-27 DIAGNOSIS — Z681 Body mass index (BMI) 19 or less, adult: Secondary | ICD-10-CM | POA: Diagnosis not present

## 2016-09-28 DIAGNOSIS — R946 Abnormal results of thyroid function studies: Secondary | ICD-10-CM | POA: Diagnosis not present

## 2016-10-11 DIAGNOSIS — F319 Bipolar disorder, unspecified: Secondary | ICD-10-CM | POA: Diagnosis not present

## 2016-10-25 ENCOUNTER — Telehealth: Payer: Self-pay | Admitting: Neurology

## 2016-10-25 NOTE — Telephone Encounter (Signed)
PT called and said she needs a refill of lamictal (spelling?)

## 2016-10-25 NOTE — Telephone Encounter (Signed)
Pt needs to make appointment with provider

## 2016-10-26 ENCOUNTER — Telehealth: Payer: Self-pay | Admitting: Neurology

## 2016-10-26 DIAGNOSIS — G40209 Localization-related (focal) (partial) symptomatic epilepsy and epileptic syndromes with complex partial seizures, not intractable, without status epilepticus: Secondary | ICD-10-CM

## 2016-10-26 MED ORDER — LAMOTRIGINE ER 200 MG PO TB24: 200.0000 mg | ORAL_TABLET | Freq: Every day | ORAL | 3 refills | Status: DC

## 2016-10-26 NOTE — Telephone Encounter (Signed)
Pharmacies updated in system.  [facility] [address] [facility] [place]. Will send future Rx's [facility]

## 2016-10-26 NOTE — Telephone Encounter (Signed)
Patient made a follow up appointment with Dr. Karel JarvisAquino for 12/21/16. She will need a refill on her lamicatal. [facility]. She was wondering should she use the mail [facility] or [facility] and then start back with mail order after next visit. She only has 10 left. Please Advise. She also asked could you call her to let her know. Thanks

## 2016-10-26 NOTE — Telephone Encounter (Signed)
[facility] [address].

## 2016-12-01 DIAGNOSIS — R946 Abnormal results of thyroid function studies: Secondary | ICD-10-CM | POA: Diagnosis not present

## 2016-12-01 DIAGNOSIS — R8299 Other abnormal findings in urine: Secondary | ICD-10-CM | POA: Diagnosis not present

## 2016-12-01 DIAGNOSIS — R7301 Impaired fasting glucose: Secondary | ICD-10-CM | POA: Diagnosis not present

## 2016-12-06 DIAGNOSIS — F3189 Other bipolar disorder: Secondary | ICD-10-CM | POA: Diagnosis not present

## 2016-12-06 DIAGNOSIS — R7301 Impaired fasting glucose: Secondary | ICD-10-CM | POA: Diagnosis not present

## 2016-12-06 DIAGNOSIS — Z23 Encounter for immunization: Secondary | ICD-10-CM | POA: Diagnosis not present

## 2016-12-06 DIAGNOSIS — Z0000 Encounter for general adult medical examination without abnormal findings: Secondary | ICD-10-CM | POA: Diagnosis not present

## 2016-12-06 DIAGNOSIS — Z1389 Encounter for screening for other disorder: Secondary | ICD-10-CM | POA: Diagnosis not present

## 2016-12-06 DIAGNOSIS — R569 Unspecified convulsions: Secondary | ICD-10-CM | POA: Diagnosis not present

## 2016-12-06 DIAGNOSIS — R630 Anorexia: Secondary | ICD-10-CM | POA: Diagnosis not present

## 2016-12-21 ENCOUNTER — Encounter: Payer: Self-pay | Admitting: Neurology

## 2016-12-21 ENCOUNTER — Ambulatory Visit (INDEPENDENT_AMBULATORY_CARE_PROVIDER_SITE_OTHER): Payer: BLUE CROSS/BLUE SHIELD | Admitting: Neurology

## 2016-12-21 VITALS — BP 100/62 | HR 92 | Ht 66.0 in | Wt 118.0 lb

## 2016-12-21 DIAGNOSIS — G40209 Localization-related (focal) (partial) symptomatic epilepsy and epileptic syndromes with complex partial seizures, not intractable, without status epilepticus: Secondary | ICD-10-CM | POA: Diagnosis not present

## 2016-12-21 DIAGNOSIS — G44209 Tension-type headache, unspecified, not intractable: Secondary | ICD-10-CM | POA: Diagnosis not present

## 2016-12-21 MED ORDER — LAMOTRIGINE ER 200 MG PO TB24: 200.0000 mg | ORAL_TABLET | Freq: Every day | ORAL | 3 refills | Status: DC

## 2016-12-21 MED ORDER — TOPIRAMATE 25 MG PO TABS: ORAL_TABLET | ORAL | 6 refills | Status: DC

## 2016-12-21 NOTE — Patient Instructions (Signed)
1. Start Topamax : take 1 tablet at night for 1 week, then increase to 2 tablets at night 2. Continue Lamictal XR  daily 3. Start weaning down on Excedrin to 2-3 times a week, otherwise headaches will not improve 4. Keep a calendar of your headaches 5. Follow-up in 6 months, call for any changes  Seizure Precautions: 1. If medication has been prescribed for you to prevent seizures, take it exactly as directed.  Do not stop taking the medicine without talking to your doctor first, even if you have not had a seizure in a long time.   2. Avoid activities in which a seizure would cause danger to yourself or to others.  Don't operate dangerous machinery, swim alone, or climb in high or dangerous places, such as on ladders, roofs, or girders.  Do not drive unless your doctor says you may.  3. If you have any warning that you may have a seizure, lay down in a safe place where you can't hurt yourself.    4.  No driving for 6 months from last seizure, as per [facility].   Please refer to the following link on the Epilepsy Foundation of America's website for more information: http://www.epilepsyfoundation.org/answerplace/Social/driving/drivingu.cfm   5.  Maintain good sleep hygiene. Avoid alcohol.  6.  Notify your neurology if you are planning pregnancy or if you become pregnant.  7.  Contact your doctor if you have any problems that may be related to the medicine you are taking.  8.  Call 911 and bring the patient back to the ED if:        A.  The seizure lasts longer than 5 minutes.       B.  The patient doesn't awaken shortly after the seizure  C.  The patient has new problems such as difficulty seeing, speaking or moving  D.  The patient was injured during the seizure  E.  The patient has a temperature over 102 F (39C)  F.  The patient vomited and now is having trouble breathing

## 2016-12-21 NOTE — Progress Notes (Signed)
NEUROLOGY FOLLOW UP OFFICE NOTE  Sherry Tate [id number]  HISTORY OF PRESENT ILLNESS: I had the pleasure of seeing Sherry Tate in follow-up in the neurology clinic on 12/21/2016. The patient was last seen a year ago for seizures. She continues to do well on Lamictal XR /day with no seizures since 04/12/14, no side effects. She denies any gaps in time, staring/unresponsive episodes, focal numbness/tingling/weakness, olfactory/gustatory hallucinations. She was reporting daily headaches on her last visit, taking Excedrin migraine on a daily basis. She was instructed to start minimizing this to avoid rebound headaches, but continues to take Excedrin 5 days a week. Headaches are over the frontal region, no associated nausea/vomiting/photo/phonophobia. She denies any dizziness, vision changes, focal numbness/tingling/weakness, no falls. She continues to have a sensation of restless feeling in her legs in the morning. She has recently noticed that her hands would shake in the morning when she helps her children with their homework. She reports a lot of anxiety.   HPI: This is a pleasant 46 yo RH woman with a history of bipolar disorder, in her usual state of health until around November/December 2015 when she had a nocturnal seizure witnessed by her husband. She was stiff and shaking for 20-30 seconds, then went back to sleep. She woke up with her muscles sore, no focal weakness, no tongue bite or incontinence. It appears she did nto seek medical attention until after another seizure on 04/12/2014 in the morning while she was doing spelling words with her daughter. She recalls saying they had to get going, then she suddenly fell back shaking and moaning. Her daughter called her husband, and when she came to, he had come back home. Her daughter reports she was answering "no" to all the questions. She is amnestic of the event. She denies any prior warning symptoms. She denies any olfactory/gustatory  hallucinations, deja vu, rising epigastric sensation, focal numbness/tingling/weakness, myoclonic jerks. She denies any alcohol or sleep deprivation prior to the seizures.   She had been taking Lamictal  qAM for 3-4 years but did not feel it was helping and tapered it off after discussing this with her psychiatrist. She had been completely off Lamictal for 2 months. She had asked to be restarted on Zoloft, which he had used for many years in the past. She had been taking Zoloft for 2 months when the seizure occurred. She takes Xanax XR  every morning since 2015, no change in dose, no missed doses. She has been taking Saphris for 3-4 years, which helps her sleep. For several years she has had mild 4/10 daily headaches that resolve after 30 minutes of taking Excedrin. There is no associated nausea, vomiting, photo/phonophobia, visual obscurations. She reports her memory is "horrible," she usually forgets what she went into a room for. She does not get lost driving, does not forget to take her medications.  Epilepsy Risk Factors: She had a normal birth and early development. There is no history of febrile convulsions, CNS infections such as meningitis/encephalitis, significant traumatic brain injury, neurosurgical procedures, or family history of seizures.  Diagnostic Data: I personally reviewed MRI brain with and without contrast which did not show any acute changes. There was asymmetry noted in the temporal lobes, with temporal horn more prominent in the right than left. 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  Routine EEG showed excess beta activity from benzodiazepine use.  24-hour EEG had shown rare left frontal sharp waves exclusively in sleep.   Her trough Lamictal level in May 2016 was 3.6.  PAST MEDICAL HISTORY: Past Medical History:  Diagnosis Date  .  Bipolar disorder [facility])     MEDICATIONS:  Outpatient Encounter Prescriptions as of 12/21/2016  Medication Sig Note  . ADDERALL XR 30 MG 24 hr capsule 30 mg. Take 2 capsules every morning 12/09/2014: Received from: External Pharmacy Received Sig: TK 2 CS PO QAM  . ALPRAZolam (XANAX XR) 3 MG 24 hr tablet Take 3 mg by mouth every morning.   . LamoTRIgine XR 200 MG TB24 Take 1 tablet (200 mg total) by mouth daily.   Marland Kitchen oxybutynin (DITROPAN) 5 MG tablet Take 5 mg by mouth. Take 2 tablets daily   . QUEtiapine (SEROQUEL) 50 MG tablet Take 50 mg by mouth. Takes 3 tablets daily   . ziprasidone (GEODON) 60 MG capsule ziprasidone 60 mg capsule  TAKE 1 CAPSULE BY MOUTH EVERY PM WITH FULL MEAL   . [DISCONTINUED] LamoTRIgine XR 200 MG TB24 Take 1 tablet (200 mg total) by mouth daily.   .     . [DISCONTINUED] asenapine (SAPHRIS) 5 MG SUBL 24 hr tablet Place 5 mg under the tongue daily.   . [DISCONTINUED] POLY-IRON 150 150 MG capsule TK 1 C PO D 06/09/2015: Received from: External Pharmacy  . [DISCONTINUED] pramipexole (MIRAPEX) 0.125 MG tablet Take 1 tablet in the morning   . [DISCONTINUED] terbinafine (LAMISIL) 250 MG tablet TK 1 T PO D 06/09/2015: Received from: External Pharmacy   No facility-administered encounter medications on file as of 12/21/2016.     ALLERGIES: Allergies  Allergen Reactions  . Sulfa Antibiotics     jaundice    FAMILY HISTORY: No family history on file.  SOCIAL HISTORY: Social History   Social History  . Marital status: Married    Spouse name: N/A  . Number of children: N/A  . Years of education: N/A   Occupational History  . Not on file.   Social History Main Topics  . Smoking status: Former Games developer  . Smokeless tobacco: Never Used  . Alcohol use No  . Drug use: No  . Sexual activity: Yes   Other Topics Concern  . Not on file   Social History Narrative  . No narrative on file    REVIEW OF SYSTEMS: Constitutional: No fevers, chills, or sweats, no  generalized fatigue, change in appetite Eyes: No visual changes, double vision, eye pain Ear, nose and throat: No hearing loss, ear pain, nasal congestion, sore throat Cardiovascular: No chest pain, palpitations Respiratory:  No shortness of breath at rest or with exertion, wheezes GastrointestinaI: No nausea, vomiting, diarrhea, abdominal pain, fecal incontinence Genitourinary:  No dysuria, urinary retention or frequency Musculoskeletal:  No neck pain, back pain Integumentary: No rash, pruritus, skin lesions Neurological: as above Psychiatric: No depression, insomnia,+ anxiety Endocrine: No palpitations, fatigue, diaphoresis, mood swings, change in appetite, change in weight, increased thirst Hematologic/Lymphatic:  No anemia, purpura, petechiae. Allergic/Immunologic: no itchy/runny eyes, nasal congestion, recent allergic reactions, rashes  PHYSICAL EXAM: Vitals:   12/21/16 0934  BP: 100/62  Pulse: 92  SpO2: 99%   General: No acute distress Head:  Normocephalic/atraumatic Neck: supple, no paraspinal tenderness, full range of motion Heart:  Regular rate and rhythm Lungs:  Clear to auscultation bilaterally Back: No paraspinal tenderness Skin/Extremities: No rash, no edema Neurological Exam: alert and oriented to person, place, and time. No aphasia or dysarthria. Fund of  knowledge is appropriate.  Recent and remote memory are intact. Attention and concentration are normal.    Able to name objects and repeat phrases. Cranial nerves: Pupils equal, round, reactive to light. Fundoscopic exam shows sharp discs bilaterally. Extraocular movements intact with no nystagmus. Visual fields full. Facial sensation intact. No facial asymmetry. Tongue, uvula, palate midline.  Motor: Bulk and tone normal, muscle strength 5/5 throughout with no pronator drift.  Sensation to light touch intact.  No extinction to double simultaneous stimulation.  Deep tendon reflexes +1 throughout, toes downgoing.  Finger to  nose testing intact.  Gait narrow-based and steady, able to tandem walk adequately.  Romberg negative.  IMPRESSION: This is a pleasant 46 yo RH woman with a history of bipolar disorder who had 2 generalized convulsions since 2015, last seizure 04/12/14. Her 24-hour EEG had shown rare left frontal epileptiform discharges. MRI brain had shown hippocampal asymmetry, concerning for possible migrational abnormality in the left temporal lobe. 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 She continues on Lamictal XR  daily with no seizures or side effects. She continues to have near daily headaches taking Excedrin migraine 5 days a week. Headaches suggestive of tension-type headaches with component of medication overuse. We discussed starting a daily headache preventative medication and to taper down Excedrin intake to 2-3 times a week, otherwise headaches will not improve. She will start low dose Topamax  qhs x 1 week, then increase to  qhs. Side effects were discussed. She reports some hand tremors in the morning, continue to monitor. She is aware of [place] driving laws to stop driving after a seizure until 6 months seizure-free, she is back to driving. She will follow-up in 6 months and knows to call our office for any changes.   Thank you for allowing me to participate in her care.  Please do not hesitate to call for any questions or concerns.  The duration of this appointment visit was 25 minutes of face-to-face time with the patient.  Greater than 50% of this time was spent in counseling, explanation of diagnosis, planning of further management, and coordination of care.   Patrcia Dolly, M.D.   CC: Dr. Clelia Croft

## 2017-01-18 DIAGNOSIS — Z1389 Encounter for screening for other disorder: Secondary | ICD-10-CM | POA: Diagnosis not present

## 2017-01-18 DIAGNOSIS — Z681 Body mass index (BMI) 19 or less, adult: Secondary | ICD-10-CM | POA: Diagnosis not present

## 2017-01-18 DIAGNOSIS — Z130 Encounter for screening for diseases of the blood and blood-forming organs and certain disorders involving the immune mechanism: Secondary | ICD-10-CM | POA: Diagnosis not present

## 2017-01-18 DIAGNOSIS — N898 Other specified noninflammatory disorders of vagina: Secondary | ICD-10-CM | POA: Diagnosis not present

## 2017-01-18 DIAGNOSIS — Z01419 Encounter for gynecological examination (general) (routine) without abnormal findings: Secondary | ICD-10-CM | POA: Diagnosis not present

## 2017-01-18 DIAGNOSIS — Z1231 Encounter for screening mammogram for malignant neoplasm of breast: Secondary | ICD-10-CM | POA: Diagnosis not present

## 2017-01-31 DIAGNOSIS — Z681 Body mass index (BMI) 19 or less, adult: Secondary | ICD-10-CM | POA: Diagnosis not present

## 2017-01-31 DIAGNOSIS — R630 Anorexia: Secondary | ICD-10-CM | POA: Diagnosis not present

## 2017-01-31 DIAGNOSIS — F319 Bipolar disorder, unspecified: Secondary | ICD-10-CM | POA: Diagnosis not present

## 2017-01-31 DIAGNOSIS — E611 Iron deficiency: Secondary | ICD-10-CM | POA: Diagnosis not present

## 2017-01-31 DIAGNOSIS — Z79899 Other long term (current) drug therapy: Secondary | ICD-10-CM | POA: Diagnosis not present

## 2017-01-31 DIAGNOSIS — L659 Nonscarring hair loss, unspecified: Secondary | ICD-10-CM | POA: Diagnosis not present

## 2017-02-01 DIAGNOSIS — L658 Other specified nonscarring hair loss: Secondary | ICD-10-CM | POA: Diagnosis not present

## 2017-02-07 DIAGNOSIS — F3174 Bipolar disorder, in full remission, most recent episode manic: Secondary | ICD-10-CM | POA: Diagnosis not present

## 2017-02-07 DIAGNOSIS — F900 Attention-deficit hyperactivity disorder, predominantly inattentive type: Secondary | ICD-10-CM | POA: Diagnosis not present

## 2017-02-07 DIAGNOSIS — F3176 Bipolar disorder, in full remission, most recent episode depressed: Secondary | ICD-10-CM | POA: Diagnosis not present

## 2017-03-24 DIAGNOSIS — L659 Nonscarring hair loss, unspecified: Secondary | ICD-10-CM | POA: Diagnosis not present

## 2017-04-21 DIAGNOSIS — Z681 Body mass index (BMI) 19 or less, adult: Secondary | ICD-10-CM | POA: Diagnosis not present

## 2017-04-21 DIAGNOSIS — F3189 Other bipolar disorder: Secondary | ICD-10-CM | POA: Diagnosis not present

## 2017-04-21 DIAGNOSIS — E611 Iron deficiency: Secondary | ICD-10-CM | POA: Diagnosis not present

## 2017-04-21 DIAGNOSIS — L659 Nonscarring hair loss, unspecified: Secondary | ICD-10-CM | POA: Diagnosis not present

## 2017-05-31 MED FILL — ALPRAZolam ER 3 MG TB24: 3 | 30 days supply | Qty: 60 | Fill #0

## 2017-06-13 MED FILL — DEXTROAMP-AMPHET ER 30 MG C: 30 | 30 days supply | Qty: 60 | Fill #0

## 2017-06-24 ENCOUNTER — Ambulatory Visit: Payer: BLUE CROSS/BLUE SHIELD | Admitting: Neurology

## 2017-07-01 MED FILL — ALPRAZolam ER 3 MG TB24: 3 | 30 days supply | Qty: 60 | Fill #1

## 2017-07-05 DIAGNOSIS — L218 Other seborrheic dermatitis: Secondary | ICD-10-CM | POA: Diagnosis not present

## 2017-07-05 DIAGNOSIS — L659 Nonscarring hair loss, unspecified: Secondary | ICD-10-CM | POA: Diagnosis not present

## 2017-07-29 MED FILL — ALPRAZolam ER 3 MG TB24: 3 | 30 days supply | Qty: 60 | Fill #2

## 2017-08-02 DIAGNOSIS — F4321 Adjustment disorder with depressed mood: Secondary | ICD-10-CM | POA: Diagnosis not present

## 2017-08-02 DIAGNOSIS — F3176 Bipolar disorder, in full remission, most recent episode depressed: Secondary | ICD-10-CM | POA: Diagnosis not present

## 2017-08-02 DIAGNOSIS — F900 Attention-deficit hyperactivity disorder, predominantly inattentive type: Secondary | ICD-10-CM | POA: Diagnosis not present

## 2017-08-02 DIAGNOSIS — F3174 Bipolar disorder, in full remission, most recent episode manic: Secondary | ICD-10-CM | POA: Diagnosis not present

## 2017-08-04 DIAGNOSIS — R351 Nocturia: Secondary | ICD-10-CM | POA: Diagnosis not present

## 2017-08-04 DIAGNOSIS — R350 Frequency of micturition: Secondary | ICD-10-CM | POA: Diagnosis not present

## 2017-08-10 DIAGNOSIS — F4323 Adjustment disorder with mixed anxiety and depressed mood: Secondary | ICD-10-CM | POA: Diagnosis not present

## 2017-12-01 DIAGNOSIS — Z713 Dietary counseling and surveillance: Secondary | ICD-10-CM | POA: Diagnosis not present

## 2017-12-08 DIAGNOSIS — R7301 Impaired fasting glucose: Secondary | ICD-10-CM | POA: Diagnosis not present

## 2017-12-08 DIAGNOSIS — Z0000 Encounter for general adult medical examination without abnormal findings: Secondary | ICD-10-CM | POA: Diagnosis not present

## 2017-12-08 DIAGNOSIS — R946 Abnormal results of thyroid function studies: Secondary | ICD-10-CM | POA: Diagnosis not present

## 2017-12-14 ENCOUNTER — Ambulatory Visit (INDEPENDENT_AMBULATORY_CARE_PROVIDER_SITE_OTHER): Payer: BLUE CROSS/BLUE SHIELD | Admitting: Psychology

## 2017-12-14 DIAGNOSIS — F3132 Bipolar disorder, current episode depressed, moderate: Secondary | ICD-10-CM

## 2017-12-15 DIAGNOSIS — R630 Anorexia: Secondary | ICD-10-CM | POA: Diagnosis not present

## 2017-12-15 DIAGNOSIS — F319 Bipolar disorder, unspecified: Secondary | ICD-10-CM | POA: Diagnosis not present

## 2017-12-15 DIAGNOSIS — R7301 Impaired fasting glucose: Secondary | ICD-10-CM | POA: Diagnosis not present

## 2017-12-15 DIAGNOSIS — E611 Iron deficiency: Secondary | ICD-10-CM | POA: Diagnosis not present

## 2017-12-15 DIAGNOSIS — Z0000 Encounter for general adult medical examination without abnormal findings: Secondary | ICD-10-CM | POA: Diagnosis not present

## 2017-12-15 DIAGNOSIS — Z23 Encounter for immunization: Secondary | ICD-10-CM | POA: Diagnosis not present

## 2017-12-28 ENCOUNTER — Ambulatory Visit (INDEPENDENT_AMBULATORY_CARE_PROVIDER_SITE_OTHER): Payer: BLUE CROSS/BLUE SHIELD | Admitting: Psychology

## 2017-12-28 DIAGNOSIS — F3132 Bipolar disorder, current episode depressed, moderate: Secondary | ICD-10-CM | POA: Diagnosis not present

## 2017-12-29 DIAGNOSIS — Z713 Dietary counseling and surveillance: Secondary | ICD-10-CM | POA: Diagnosis not present

## 2018-01-02 ENCOUNTER — Ambulatory Visit (INDEPENDENT_AMBULATORY_CARE_PROVIDER_SITE_OTHER): Payer: BLUE CROSS/BLUE SHIELD | Admitting: Neurology

## 2018-01-02 ENCOUNTER — Encounter: Payer: Self-pay | Admitting: Neurology

## 2018-01-02 ENCOUNTER — Encounter

## 2018-01-02 ENCOUNTER — Other Ambulatory Visit: Payer: Self-pay

## 2018-01-02 DIAGNOSIS — G40209 Localization-related (focal) (partial) symptomatic epilepsy and epileptic syndromes with complex partial seizures, not intractable, without status epilepticus: Secondary | ICD-10-CM

## 2018-01-02 MED ORDER — LAMOTRIGINE ER 200 MG PO TB24: 200.0000 mg | ORAL_TABLET | Freq: Every day | ORAL | 3 refills | Status: DC

## 2018-01-02 NOTE — Patient Instructions (Signed)
Great seeing you! Continue Lamictal XR 200mg  daily. Continue follow-up with Dr. Evelene Croon. Follow-up in 1 year, call for any changes.  Seizure Precautions: 1. If medication has been prescribed for you to prevent seizures, take it exactly as directed.  Do not stop taking the medicine without talking to your doctor first, even if you have not had a seizure in a long time.   2. Avoid activities in which a seizure would cause danger to yourself or to others.  Don't operate dangerous machinery, swim alone, or climb in high or dangerous places, such as on ladders, roofs, or girders.  Do not drive unless your doctor says you may.  3. If you have any warning that you may have a seizure, lay down in a safe place where you can't hurt yourself.    4.  No driving for 6 months from last seizure, as per [facility].   Please refer to the following link on the [employer]ile[employer]'s website for more information: http://www.epilepsyfoundation.org/answerplace/Social/driving/drivingu.cfm   5.  Maintain good sleep hygiene. Avoid alcohol  6.  Notify your neurology if you are planning pregnancy or if you become pregnant.  7.  Contact your doctor if you have any problems that may be related to the medicine you are taking.  8.  Call 911 and bring the patient back to the ED if:        A.  The seizure lasts longer than 5 minutes.       B.  The patient doesn't awaken shortly after the seizure  C.  The patient has new problems such as difficulty seeing, speaking or moving  D.  The patient was injured during the seizure  E.  The patient has a temperature over 102 F (39C)  F.  The patient vomited and now is having trouble breathing

## 2018-01-02 NOTE — Progress Notes (Signed)
NEUROLOGY FOLLOW UP OFFICE NOTE  Sherry Tate [id number]  DOB: [birth year]  HISTORY OF PRESENT ILLNESS: I had the pleasure of seeing Sherry Tate in follow-up in the neurology clinic on 01/02/2018. The patient was last seen a year ago for seizures. She is accompanied by her daughter who helps supplement the history today. She denies any seizures or seizure-like symptoms since January 2016. She is taking Lamictal XR 200mg  daily for seizure prophylaxis and mood stabilization. Her daughter denies any staring/unresponsive episodes, she denies any gaps in time, focal numbness/tingling/weakness, olfactory/gustatory hallucinations, myoclonic jerks. She was reporting headaches on her last visit and was given a prescription for Topamax, but did not start this. She continues to have headaches but states they are tolerable and responds to Excedrin. She denies any dizziness, diplopia, gait instability. She had a fall in the garden with no injuries. She does not feel her mood medications are 100% and was wondering about the Lamictal, and will discuss with her psychiatrist Dr. Evelene Croon.   History on Initial Assessment 04/23/2014: This is a pleasant 47 yo RH woman with a history of bipolar disorder, in her usual state of health until around November/December 2015 when she had a nocturnal seizure witnessed by her husband. She was stiff and shaking for 20-30 seconds, then went back to sleep. She woke up with her muscles sore, no focal weakness, no tongue bite or incontinence. It appears she did nto seek medical attention until after another seizure on 04/12/2014 in the morning while she was doing spelling words with her daughter. She recalls saying they had to get going, then she suddenly fell back shaking and moaning. Her daughter called her husband, and when she came to, he had come back home. Her daughter reports she was answering "no" to all the questions. She is amnestic of the event. She denies any prior warning  symptoms. She denies any olfactory/gustatory hallucinations, deja vu, rising epigastric sensation, focal numbness/tingling/weakness, myoclonic jerks. She denies any alcohol or sleep deprivation prior to the seizures.   She had been taking Lamictal 200mg  qAM for 3-4 years but did not feel it was helping and tapered it off after discussing this with her psychiatrist. She had been completely off Lamictal for 2 months. She had asked to be restarted on Zoloft, which he had used for many years in the past. She had been taking Zoloft for 2 months when the seizure occurred. She takes Xanax XR 3mg  every morning since 2015, no change in dose, no missed doses. She has been taking Saphris for 3-4 years, which helps her sleep. For several years she has had mild 4/10 daily headaches that resolve after 30 minutes of taking Excedrin. There is no associated nausea, vomiting, photo/phonophobia, visual obscurations. She reports her memory is "horrible," she usually forgets what she went into a room for. She does not get lost driving, does not forget to take her medications.  Epilepsy Risk Factors: She had a normal birth and early development. There is no history of febrile convulsions, CNS infections such as meningitis/encephalitis, significant traumatic brain injury, neurosurgical procedures, or family history of seizures.  Diagnostic Data: I personally reviewed MRI brain with and without contrast which did not show any acute changes. There was asymmetry noted in the temporal lobes, with temporal horn more prominent in the right than left. 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  Routine  EEG showed excess beta activity from benzodiazepine use.  24-hour EEG had shown rare left frontal sharp waves exclusively in sleep.   Her trough Lamictal level in May 2016 was 3.6.  PAST MEDICAL  HISTORY: Past Medical History:  Diagnosis Date  . Bipolar disorder [facility])     MEDICATIONS:  Outpatient Encounter Medications as of 01/02/2018  Medication Sig Note  . ADDERALL XR 30 MG 24 hr capsule 30 mg. Take 2 capsules every morning 12/09/2014: Received from: External Pharmacy Received Sig: TK 2 CS PO QAM  . ALPRAZolam (XANAX XR) 3 MG 24 hr tablet Take 3 mg by mouth every morning.   . LamoTRIgine 200 MG TB24 24 hour tablet Take 1 tablet (200 mg total) by mouth daily.   Marland Kitchen oxybutynin (DITROPAN) 5 MG tablet Take 5 mg by mouth. Take 2 tablets daily   . QUEtiapine (SEROQUEL) 50 MG tablet Take 50 mg by mouth. Takes 3 tablets daily   . ziprasidone (GEODON) 60 MG capsule ziprasidone 60 mg capsule  TAKE 1 CAPSULE BY MOUTH EVERY PM WITH FULL MEAL    No facility-administered encounter medications on file as of 01/02/2018.      ALLERGIES: Allergies  Allergen Reactions  . Sulfa Antibiotics     jaundice    FAMILY HISTORY: No family history on file.  SOCIAL HISTORY: Social History   Socioeconomic History  . Marital status: Married    Spouse name: Not on file  . Number of children: Not on file  . Years of education: Not on file  . Highest education level: Not on file  Occupational History  . Not on file  Social Needs  . Financial resource strain: Not on file  . Food insecurity:    Worry: Not on file    Inability: Not on file  . Transportation needs:    Medical: Not on file    Non-medical: Not on file  Tobacco Use  . Smoking status: Former Games developer  . Smokeless tobacco: Never Used  Substance and Sexual Activity  . Alcohol use: No    Alcohol/week: 0.0 standard drinks  . Drug use: No  . Sexual activity: Yes  Lifestyle  . Physical activity:    Days per week: Not on file    Minutes per session: Not on file  . Stress: Not on file  Relationships  . Social connections:    Talks on phone: Not on file    Gets together: Not on file    Attends religious service: Not on file     Active member of club or organization: Not on file    Attends meetings of clubs or organizations: Not on file    Relationship status: Not on file  . Intimate partner violence:    Fear of current or ex partner: Not on file    Emotionally abused: Not on file    Physically abused: Not on file    Forced sexual activity: Not on file  Other Topics Concern  . Not on file  Social History Narrative  . Not on file    REVIEW OF SYSTEMS: Constitutional: No fevers, chills, or sweats, no generalized fatigue, change in appetite Eyes: No visual changes, double vision, eye pain Ear, nose and throat: No hearing loss, ear pain, nasal congestion, sore throat Cardiovascular: No chest pain, palpitations Respiratory:  No shortness of breath at rest or with exertion, wheezes GastrointestinaI: No nausea, vomiting, diarrhea, abdominal pain, fecal incontinence Genitourinary:  No dysuria, urinary retention or frequency Musculoskeletal:  No  neck pain, back pain Integumentary: No rash, pruritus, skin lesions Neurological: as above Psychiatric: + depression, anxiety Endocrine: No palpitations, fatigue, diaphoresis, mood swings, change in appetite, change in weight, increased thirst Hematologic/Lymphatic:  No anemia, purpura, petechiae. Allergic/Immunologic: no itchy/runny eyes, nasal congestion, recent allergic reactions, rashes  PHYSICAL EXAM: Vitals:   01/02/18 0832  BP: 102/74  Pulse: 85  SpO2: 98%   General: No acute distress Head:  Normocephalic/atraumatic Neck: supple, no paraspinal tenderness, full range of motion Heart:  Regular rate and rhythm Lungs:  Clear to auscultation bilaterally Back: No paraspinal tenderness Skin/Extremities: No rash, no edema Neurological Exam: alert and oriented to person, place, and time. No aphasia or dysarthria. Fund of knowledge is appropriate.  Recent and remote memory are intact. 3/3 delayed recall. Attention and concentration are normal.    Able to name objects  and repeat phrases. Cranial nerves: Pupils equal, round, reactive to light. Extraocular movements intact with no nystagmus. Visual fields full. Facial sensation intact. No facial asymmetry. Tongue, uvula, palate midline.  Motor: Bulk and tone normal, muscle strength 5/5 throughout with no pronator drift.  Sensation to light touch intact.  No extinction to double simultaneous stimulation.  Finger to nose testing intact.  Gait narrow-based and steady, able to tandem walk adequately.  Romberg negative.  IMPRESSION: This is a pleasant 47 yo RH woman with a history of bipolar disorder who had 2 generalized convulsions since 2015, last seizure 04/12/14. Her 24-hour EEG had shown rare left frontal epileptiform discharges. MRI brain had shown hippocampal asymmetry, concerning for possible migrational abnormality in the left temporal lobe. 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 No seizures since January 2016, at that time we was off Lamictal, she has been seizure-free since restarting and continuing Lamictal XR 200mg  daily. She feels her mood is not "100%" and asked about other seizure medications that can help with mood, we discussed talking to Dr. Evelene Croon about oxcarbazepine and Depakote, if they are a good fit for her mood disorder, she feels she may continue with Lamictal. Continue to monitor headaches, she knows to minimize headache medication to 2-3 a week to avoid rebound headaches. She is aware of [place] driving laws to stop driving after a seizure until 6 months seizure-free, she is back to driving. She will follow-up in 1 year and knows to call our office for any changes.   Thank you for allowing me to participate in her care.  Please do not hesitate to call for any questions or concerns.  The duration of this appointment visit was 26 minutes of face-to-face time with the patient.  Greater than 50% of this time was spent in counseling, explanation of  diagnosis, planning of further management, and coordination of care.   Patrcia Dolly, M.D.   CC: Dr. Clelia Croft, Dr. Evelene Croon

## 2018-01-07 ENCOUNTER — Other Ambulatory Visit: Payer: Self-pay | Admitting: Neurology

## 2018-01-07 DIAGNOSIS — G40209 Localization-related (focal) (partial) symptomatic epilepsy and epileptic syndromes with complex partial seizures, not intractable, without status epilepticus: Secondary | ICD-10-CM

## 2018-01-11 ENCOUNTER — Ambulatory Visit (INDEPENDENT_AMBULATORY_CARE_PROVIDER_SITE_OTHER): Payer: BLUE CROSS/BLUE SHIELD | Admitting: Psychology

## 2018-01-11 DIAGNOSIS — F3132 Bipolar disorder, current episode depressed, moderate: Secondary | ICD-10-CM | POA: Diagnosis not present

## 2018-01-31 DIAGNOSIS — F3176 Bipolar disorder, in full remission, most recent episode depressed: Secondary | ICD-10-CM | POA: Diagnosis not present

## 2018-01-31 DIAGNOSIS — F900 Attention-deficit hyperactivity disorder, predominantly inattentive type: Secondary | ICD-10-CM | POA: Diagnosis not present

## 2018-01-31 DIAGNOSIS — F3174 Bipolar disorder, in full remission, most recent episode manic: Secondary | ICD-10-CM | POA: Diagnosis not present

## 2018-02-03 ENCOUNTER — Ambulatory Visit (INDEPENDENT_AMBULATORY_CARE_PROVIDER_SITE_OTHER): Payer: BLUE CROSS/BLUE SHIELD | Admitting: Psychology

## 2018-02-03 DIAGNOSIS — F3132 Bipolar disorder, current episode depressed, moderate: Secondary | ICD-10-CM

## 2018-02-15 DIAGNOSIS — N760 Acute vaginitis: Secondary | ICD-10-CM | POA: Diagnosis not present

## 2018-02-15 DIAGNOSIS — Z124 Encounter for screening for malignant neoplasm of cervix: Secondary | ICD-10-CM | POA: Diagnosis not present

## 2018-02-15 DIAGNOSIS — B373 Candidiasis of vulva and vagina: Secondary | ICD-10-CM | POA: Diagnosis not present

## 2018-02-15 DIAGNOSIS — Z130 Encounter for screening for diseases of the blood and blood-forming organs and certain disorders involving the immune mechanism: Secondary | ICD-10-CM | POA: Diagnosis not present

## 2018-02-15 DIAGNOSIS — Z1231 Encounter for screening mammogram for malignant neoplasm of breast: Secondary | ICD-10-CM | POA: Diagnosis not present

## 2018-02-15 DIAGNOSIS — R61 Generalized hyperhidrosis: Secondary | ICD-10-CM | POA: Diagnosis not present

## 2018-02-15 DIAGNOSIS — N898 Other specified noninflammatory disorders of vagina: Secondary | ICD-10-CM | POA: Diagnosis not present

## 2018-02-15 DIAGNOSIS — Z01419 Encounter for gynecological examination (general) (routine) without abnormal findings: Secondary | ICD-10-CM | POA: Diagnosis not present

## 2018-02-16 ENCOUNTER — Ambulatory Visit (INDEPENDENT_AMBULATORY_CARE_PROVIDER_SITE_OTHER): Payer: BLUE CROSS/BLUE SHIELD | Admitting: Psychology

## 2018-02-16 DIAGNOSIS — F3132 Bipolar disorder, current episode depressed, moderate: Secondary | ICD-10-CM | POA: Diagnosis not present

## 2018-02-17 DIAGNOSIS — R61 Generalized hyperhidrosis: Secondary | ICD-10-CM | POA: Diagnosis not present

## 2018-02-17 DIAGNOSIS — Z713 Dietary counseling and surveillance: Secondary | ICD-10-CM | POA: Diagnosis not present

## 2018-03-15 DIAGNOSIS — F319 Bipolar disorder, unspecified: Secondary | ICD-10-CM | POA: Diagnosis not present

## 2018-03-15 DIAGNOSIS — Z6820 Body mass index (BMI) 20.0-20.9, adult: Secondary | ICD-10-CM | POA: Diagnosis not present

## 2018-03-16 DIAGNOSIS — Z713 Dietary counseling and surveillance: Secondary | ICD-10-CM | POA: Diagnosis not present

## 2018-03-28 ENCOUNTER — Ambulatory Visit (INDEPENDENT_AMBULATORY_CARE_PROVIDER_SITE_OTHER): Payer: BLUE CROSS/BLUE SHIELD | Admitting: Psychology

## 2018-03-28 DIAGNOSIS — F3132 Bipolar disorder, current episode depressed, moderate: Secondary | ICD-10-CM | POA: Diagnosis not present

## 2018-04-06 DIAGNOSIS — Z713 Dietary counseling and surveillance: Secondary | ICD-10-CM | POA: Diagnosis not present

## 2018-04-12 ENCOUNTER — Ambulatory Visit (INDEPENDENT_AMBULATORY_CARE_PROVIDER_SITE_OTHER): Payer: BLUE CROSS/BLUE SHIELD | Admitting: Psychology

## 2018-04-12 DIAGNOSIS — F3132 Bipolar disorder, current episode depressed, moderate: Secondary | ICD-10-CM

## 2018-04-20 DIAGNOSIS — Z713 Dietary counseling and surveillance: Secondary | ICD-10-CM | POA: Diagnosis not present

## 2018-05-04 DIAGNOSIS — Z713 Dietary counseling and surveillance: Secondary | ICD-10-CM | POA: Diagnosis not present

## 2018-05-22 DIAGNOSIS — G4709 Other insomnia: Secondary | ICD-10-CM | POA: Diagnosis not present

## 2018-05-22 DIAGNOSIS — F900 Attention-deficit hyperactivity disorder, predominantly inattentive type: Secondary | ICD-10-CM | POA: Diagnosis not present

## 2018-05-22 DIAGNOSIS — F3176 Bipolar disorder, in full remission, most recent episode depressed: Secondary | ICD-10-CM | POA: Diagnosis not present

## 2018-05-22 DIAGNOSIS — F3174 Bipolar disorder, in full remission, most recent episode manic: Secondary | ICD-10-CM | POA: Diagnosis not present

## 2018-05-27 ENCOUNTER — Emergency Department (HOSPITAL_COMMUNITY)
Admission: EM | Admit: 2018-05-27 | Discharge: 2018-05-27 | Disposition: A | Discharge status: Home / Self Care | Payer: BLUE CROSS/BLUE SHIELD | Attending: Emergency Medicine | Admitting: Emergency Medicine

## 2018-05-27 ENCOUNTER — Encounter (HOSPITAL_COMMUNITY): Payer: Self-pay

## 2018-05-27 ENCOUNTER — Other Ambulatory Visit: Payer: Self-pay

## 2018-05-27 ENCOUNTER — Emergency Department (HOSPITAL_COMMUNITY): Payer: BLUE CROSS/BLUE SHIELD

## 2018-05-27 DIAGNOSIS — R569 Unspecified convulsions: Secondary | ICD-10-CM | POA: Diagnosis not present

## 2018-05-27 DIAGNOSIS — Z87891 Personal history of nicotine dependence: Secondary | ICD-10-CM | POA: Diagnosis not present

## 2018-05-27 DIAGNOSIS — R000 Tachycardia, unspecified: Secondary | ICD-10-CM | POA: Diagnosis not present

## 2018-05-27 DIAGNOSIS — S0990XA Unspecified injury of head, initial encounter: Secondary | ICD-10-CM | POA: Diagnosis not present

## 2018-05-27 DIAGNOSIS — R404 Transient alteration of awareness: Secondary | ICD-10-CM | POA: Diagnosis not present

## 2018-05-27 DIAGNOSIS — G40909 Epilepsy, unspecified, not intractable, without status epilepticus: Secondary | ICD-10-CM | POA: Diagnosis not present

## 2018-05-27 DIAGNOSIS — Z79899 Other long term (current) drug therapy: Secondary | ICD-10-CM | POA: Diagnosis not present

## 2018-05-27 DIAGNOSIS — R410 Disorientation, unspecified: Secondary | ICD-10-CM | POA: Diagnosis not present

## 2018-05-27 LAB — COMPREHENSIVE METABOLIC PANEL
ALT: 23 U/L (ref 0–44)
AST: 31 U/L (ref 15–41)
Albumin: 4.4 g/dL (ref 3.5–5.0)
Alkaline Phosphatase: 41 U/L (ref 38–126)
Anion gap: 6 (ref 5–15)
BUN: 11 mg/dL (ref 6–20)
CO2: 27 mmol/L (ref 22–32)
Calcium: 8.6 mg/dL — ABNORMAL LOW (ref 8.9–10.3)
Chloride: 106 mmol/L (ref 98–111)
Creatinine, Ser: 0.8 mg/dL (ref 0.44–1.00)
GFR calc Af Amer: >60 mL/min (ref >60)
GFR calc non Af Amer: >60 mL/min (ref >60)
Glucose, Bld: 188 mg/dL — ABNORMAL HIGH (ref 70–99)
POTASSIUM: 3.5 mmol/L (ref 3.5–5.1)
Sodium: 139 mmol/L (ref 135–145)
Total Bilirubin: 0.4 mg/dL (ref 0.3–1.2)
Total Protein: 6.7 g/dL (ref 6.5–8.1)

## 2018-05-27 LAB — CBC WITH DIFFERENTIAL/PLATELET
ABS IMMATURE GRANULOCYTES: 0.15 10*3/uL — AB (ref 0.00–0.07)
BASOS PCT: 1 %
Basophils Absolute: 0.1 10*3/uL (ref 0.0–0.1)
Eosinophils Absolute: 0 10*3/uL (ref 0.0–0.5)
Eosinophils Relative: 0 %
HCT: 38.8 % (ref 36.0–46.0)
Hemoglobin: 12.7 g/dL (ref 12.0–15.0)
Immature Granulocytes: 1 %
Lymphocytes Relative: 10 %
Lymphs Abs: 1.3 10*3/uL (ref 0.7–4.0)
MCH: 33.2 pg (ref 26.0–34.0)
MCHC: 32.7 g/dL (ref 30.0–36.0)
MCV: 101.3 fL — ABNORMAL HIGH (ref 80.0–100.0)
Monocytes Absolute: 0.6 10*3/uL (ref 0.1–1.0)
Monocytes Relative: 5 %
Neutro Abs: 10.8 10*3/uL — ABNORMAL HIGH (ref 1.7–7.7)
Neutrophils Relative %: 83 %
Platelets: 210 10*3/uL (ref 150–400)
RBC: 3.83 MIL/uL — ABNORMAL LOW (ref 3.87–5.11)
RDW: 12.6 % (ref 11.5–15.5)
WBC: 13 10*3/uL — ABNORMAL HIGH (ref 4.0–10.5)
nRBC: 0 % (ref 0.0–0.2)

## 2018-05-27 LAB — I-STAT BETA HCG BLOOD, ED (MC, WL, AP ONLY): I-stat hCG, quantitative: <5 m[IU]/mL (ref <5)

## 2018-05-27 MED ORDER — ONDANSETRON HCL 4 MG/2ML IJ SOLN
4.0000 mg | Freq: Once | INTRAMUSCULAR | Status: AC
  Administered 2018-05-27: 4 mg via INTRAVENOUS
  Filled 2018-05-27: qty 2

## 2018-05-27 MED ORDER — ACETAMINOPHEN 500 MG PO TABS
1000.0000 mg | ORAL_TABLET | Freq: Once | ORAL | Status: AC
  Administered 2018-05-27: 1000 mg via ORAL
  Filled 2018-05-27: qty 2

## 2018-05-27 MED ORDER — SODIUM CHLORIDE 0.9 % IV BOLUS
1000.0000 mL | Freq: Once | INTRAVENOUS | Status: AC
  Administered 2018-05-27: 1000 mL via INTRAVENOUS

## 2018-05-27 MED ORDER — LEVETIRACETAM IN NACL 1000 MG/100ML IV SOLN
1000.0000 mg | Freq: Once | INTRAVENOUS | Status: AC
  Administered 2018-05-27: 1000 mg via INTRAVENOUS
  Filled 2018-05-27: qty 100

## 2018-05-27 NOTE — ED Provider Notes (Addendum)
[facility]-EMERGENCY DEPT Provider Note   CSN: [id number] Arrival date & time: 05/27/18  1314    History   Chief Complaint No chief complaint on file.   HPI Sherry Tate is a 48 y.o. female.     HPI  Hx of seizures, has not had one for 3-4 years, has been compliant with medications with exception of not taking xanax this AM.  Thought maybe she wouldn't need it and stopped taking it. Was at salon and had seizure. Per report lasted approximately 1 minute. No LOC bowel or bladder, no tongue biting.  Hx of only 2 prior seizures in the past.  Has not felt this badly after a seizure before but doesn't remember because it has been so long and so few.  In normal state of health this AM, no fevers, no cough, no headache, no numbness or weakness.  Had some blurred vision prior to seizure and that is the last she remembers.    Headache 7/10 Nausea and vomiting after Dr. Karel Jarvis is Neurologist Xanax  XR BID Lamictal  QD No medication changes Had just received rx for other new medicine but hasn't picked it up  Past Medical History:  Diagnosis Date  . Bipolar disorder [facility])     Patient Active Problem List   Diagnosis Date Noted  . Cephalalgia 12/10/2015  . RLS (restless legs syndrome) 06/09/2015  . Localization-related symptomatic epilepsy and epileptic syndromes with complex partial seizures, not intractable, without status epilepticus (HCC) 08/07/2014    Past Surgical History:  Procedure Laterality Date  . APPENDECTOMY       OB History   No obstetric history on file.      Home Medications    Prior to Admission medications   Medication Sig Start Date End Date Taking? Authorizing Provider  ADDERALL XR 30 MG 24 hr capsule Take 30 mg by mouth 2 (two) times daily.  11/19/14  Yes [provider]  ALPRAZolam (XANAX XR) 3 MG 24 hr tablet Take 3 mg by mouth 2 (two) times daily.    Yes [provider]  BIOTIN PO Take 1 tablet by  mouth daily.   Yes [provider]  LamoTRIgine 200 MG TB24 24 hour tablet Take 1 tablet (200 mg total) by mouth daily. 01/02/18  Yes Van Clines, MD  oxybutynin (DITROPAN) 5 MG tablet Take 5 mg by mouth daily.    Yes [provider]  QUEtiapine (SEROQUEL) 300 MG tablet Take 300 mg by mouth daily.    Yes [provider]  ziprasidone (GEODON) 60 MG capsule Take 60 mg by mouth 2 (two) times daily with a meal.    Yes [provider]    Family History No family history on file.  Social History Social History   Tobacco Use  . Smoking status: Former Games developer  . Smokeless tobacco: Never Used  Substance Use Topics  . Alcohol use: No    Alcohol/week: 0.0 standard drinks  . Drug use: No     Allergies   Sulfa antibiotics   Review of Systems Review of Systems  Constitutional: Negative for fever.  HENT: Negative for sore throat.   Eyes: Negative for visual disturbance.  Respiratory: Negative for cough and shortness of breath.   Cardiovascular: Negative for chest pain.  Gastrointestinal: Positive for nausea and vomiting. Negative for abdominal pain and diarrhea.  Genitourinary: Negative for difficulty urinating.  Musculoskeletal: Negative for back pain and neck pain.  Skin: Negative for rash.  Neurological:  Positive for headaches. Negative for syncope, weakness and numbness.     Physical Exam Updated Vital Signs BP 112/75   Pulse 78   Resp 16   SpO2 100%   Physical Exam Vitals signs and nursing note reviewed.  Constitutional:      General: She is not in acute distress.    Appearance: She is well-developed. She is not diaphoretic.  HENT:     Head: Normocephalic and atraumatic.  Eyes:     General: No visual field deficit (initially exam inconsistent however then normal).    Conjunctiva/sclera: Conjunctivae normal.  Neck:     Musculoskeletal: Normal range of motion.  Cardiovascular:     Rate and Rhythm: Normal rate and regular rhythm.       Heart sounds: Normal heart sounds. No murmur. No friction rub. No gallop.   Pulmonary:     Effort: Pulmonary effort is normal. No respiratory distress.     Breath sounds: Normal breath sounds. No wheezing or rales.  Abdominal:     General: There is no distension.     Palpations: Abdomen is soft.     Tenderness: There is no abdominal tenderness. There is no guarding.  Musculoskeletal:        General: No tenderness.  Skin:    General: Skin is warm and dry.     Findings: No erythema or rash.  Neurological:     Mental Status: She is alert and oriented to person, place, and time.     GCS: GCS eye subscore is 4. GCS verbal subscore is 5. GCS motor subscore is 6.     Cranial Nerves: Cranial nerves are intact. No cranial nerve deficit or dysarthria.     Sensory: Sensation is intact. No sensory deficit.     Motor: Motor function is intact.     Coordination: Coordination is intact.      ED Treatments / Results  Labs (all labs ordered are listed, but only abnormal results are displayed) Labs Reviewed  CBC WITH DIFFERENTIAL/PLATELET - Abnormal; Notable for the following components:      Result Value   WBC 13.0 (*)    RBC 3.83 (*)    MCV 101.3 (*)    Neutro Abs 10.8 (*)    Abs Immature Granulocytes 0.15 (*)    All other components within normal limits  COMPREHENSIVE METABOLIC PANEL - Abnormal; Notable for the following components:   Glucose, Bld 188 (*)    Calcium 8.6 (*)    All other components within normal limits  LAMOTRIGINE LEVEL  I-STAT BETA HCG BLOOD, ED ([facility], [facility], [facility] ONLY)    EKG EKG Interpretation  Date/Time:  Saturday May 27 2018 14:56:18 EST Ventricular Rate:  66 PR Interval:    QRS Duration: 94 QT Interval:  452 QTC Calculation: 474 R Axis:   87 Text Interpretation:  Sinus rhythm No significant change since last tracing Confirmed by Alvira Monday ([id number]) on 05/27/2018 7:24:17 PM   Radiology Ct Head Wo Contrast  Result Date: 05/27/2018 CLINICAL  DATA:  Recent seizure activity EXAM: CT HEAD WITHOUT CONTRAST TECHNIQUE: Contiguous axial images were obtained from the base of the skull through the vertex without intravenous contrast. COMPARISON:  04/28/2014 FINDINGS: Brain: No evidence of acute infarction, hemorrhage, hydrocephalus, extra-axial collection or mass lesion/mass effect. Vascular: No hyperdense vessel or unexpected calcification. Skull: Normal. Negative for fracture or focal lesion. Sinuses/Orbits: No acute finding. Other: None. IMPRESSION: Normal head CT Electronically Signed   By: Eulah Pont.D.  On: 05/27/2018 16:12    Procedures Procedures (including critical care time)  Medications Ordered in ED Medications  levETIRAcetam (KEPPRA) IVPB 1000 mg/100 mL premix (0 mg Intravenous Stopped 05/27/18 1707)  sodium chloride 0.9 % bolus 1,000 mL (0 mLs Intravenous Stopped 05/27/18 1707)  ondansetron (ZOFRAN) injection 4 mg (4 mg Intravenous Given 05/27/18 1404)  acetaminophen (TYLENOL) tablet 1,000 mg (1,000 mg Oral Given 05/27/18 1404)     Initial Impression / Assessment and Plan / ED Course  I have reviewed the triage vital signs and the nursing notes.  Pertinent labs & imaging results that were available during my care of the patient were reviewed by me and considered in my medical decision making (see chart for details).        48 year old female with a history of epilepsy presents with concern for witnessed seizure.  She has not had a seizure in approximately 4 years, and reports her headache and nausea following the seizure are worse than what she remembers, will order CT head for further evaluation.  CT without acute findings.  Labs WNL.  Suspect seizure brought on by not taking dose of 3mg  xanax XR today.  She took dose on arrival to the ED and I also gave her keppra IV.  Discussed with Neurology who agrees that seizure secondary to missing xanax dose and recommends continuing regular medication regimen and following up  with Dr. Karel Jarvis.  She is back to baseline with a normal neurologic exam. Patient discharged in stable condition with understanding of reasons to return.     Final Clinical Impressions(s) / ED Diagnoses   Final diagnoses:  Seizure [facility])    ED Discharge Orders    None          Alvira Monday, MD 05/27/18 [phone]

## 2018-05-27 NOTE — ED Triage Notes (Signed)
Patient presented to ed with c/o witness seizure at the salon. Patient c/o nausea. patient state she been taking her medication as schedule.

## 2018-05-27 NOTE — Discharge Instructions (Addendum)
Take your medications as prescribed and follow up with your Neurologist.  Do not drive until instructed by your Neurologist.

## 2018-05-29 ENCOUNTER — Telehealth: Payer: Self-pay | Admitting: Neurology

## 2018-05-29 LAB — LAMOTRIGINE LEVEL: LAMOTRIGINE LVL: 4.2 ug/mL (ref 2.0–20.0)

## 2018-05-29 NOTE — Telephone Encounter (Signed)
Patient is reporting a Saturday seizure. Please call her back at [phone]. Thanks!

## 2018-05-31 NOTE — Telephone Encounter (Signed)
Patient had a seizure this past weekend and wants to know how long she can not drive. Please call patient

## 2018-06-01 NOTE — Telephone Encounter (Signed)
Spoke with pt.  She states that on Saturday she decided to see "how she would do" if she just d/c Xanax (takes 3mg  BID).  Pt states that she had seizure within hours of first missed dose.  It is noted in ED notes that this may have contributed to pt's seizure.  I advised pt that pt [place] laws, pt is not to drive until 6 months event free.  Pt verbalized understanding.

## 2018-06-14 ENCOUNTER — Other Ambulatory Visit: Payer: Self-pay

## 2018-06-14 ENCOUNTER — Ambulatory Visit (INDEPENDENT_AMBULATORY_CARE_PROVIDER_SITE_OTHER): Payer: BLUE CROSS/BLUE SHIELD | Admitting: Psychology

## 2018-06-14 DIAGNOSIS — F3132 Bipolar disorder, current episode depressed, moderate: Secondary | ICD-10-CM | POA: Diagnosis not present

## 2018-06-29 ENCOUNTER — Ambulatory Visit (INDEPENDENT_AMBULATORY_CARE_PROVIDER_SITE_OTHER): Payer: BLUE CROSS/BLUE SHIELD | Admitting: Psychology

## 2018-06-29 DIAGNOSIS — F3132 Bipolar disorder, current episode depressed, moderate: Secondary | ICD-10-CM | POA: Diagnosis not present

## 2018-07-19 ENCOUNTER — Ambulatory Visit: Payer: BLUE CROSS/BLUE SHIELD | Admitting: Psychology

## 2018-07-26 ENCOUNTER — Ambulatory Visit (INDEPENDENT_AMBULATORY_CARE_PROVIDER_SITE_OTHER): Payer: BLUE CROSS/BLUE SHIELD | Admitting: Psychology

## 2018-07-26 DIAGNOSIS — F3132 Bipolar disorder, current episode depressed, moderate: Secondary | ICD-10-CM

## 2018-07-28 DIAGNOSIS — N926 Irregular menstruation, unspecified: Secondary | ICD-10-CM | POA: Insufficient documentation

## 2018-08-01 ENCOUNTER — Telehealth (INDEPENDENT_AMBULATORY_CARE_PROVIDER_SITE_OTHER): Payer: BLUE CROSS/BLUE SHIELD | Admitting: Neurology

## 2018-08-01 ENCOUNTER — Ambulatory Visit: Payer: Self-pay | Admitting: Neurology

## 2018-08-01 ENCOUNTER — Other Ambulatory Visit: Payer: Self-pay

## 2018-08-01 VITALS — Ht 66.0 in | Wt 130.0 lb

## 2018-08-01 DIAGNOSIS — G44209 Tension-type headache, unspecified, not intractable: Secondary | ICD-10-CM

## 2018-08-01 DIAGNOSIS — G40209 Localization-related (focal) (partial) symptomatic epilepsy and epileptic syndromes with complex partial seizures, not intractable, without status epilepticus: Secondary | ICD-10-CM

## 2018-08-01 MED ORDER — LAMOTRIGINE ER 200 MG PO TB24: 200.0000 mg | ORAL_TABLET | Freq: Every day | ORAL | 3 refills | Status: DC

## 2018-08-01 NOTE — Progress Notes (Signed)
Virtual Visit via Video Note The purpose of this virtual visit is to provide medical care while limiting exposure to the novel coronavirus.    Consent was obtained for video visit:  Yes.   Answered questions that patient had about telehealth interaction:  Yes.   I discussed the limitations, risks, security and privacy concerns of performing an evaluation and management service by telemedicine. I also discussed with the patient that there may be a patient responsible charge related to this service. The patient expressed understanding and agreed to proceed.  Pt location: Home Physician Location: office Name of referring provider:  Martha Clan, MD I connected with Sherry Tate at patients initiation/request on 08/01/2018 at 11:30 AM EDT by video enabled telemedicine application and verified that I am speaking with the correct person using two identifiers. Pt MRN:  [id number] Pt DOB:  Jun 17, 1970 Video Participants:  Sherry Tate   History of Present Illness:  The patient was last seen in October 2019 for epilepsy. She had been doing well, seizure-free for 4 years until she had a witnessed convulsion on 05/27/2018. She had decided to stop high dose Xanax (takes 3mg  BID), and after missing 3 doses, had a GTC while at a hair salon. She recalls that she was having difficulty focusing her vision while looking at her phone, then woke up on the ground with tongue bite. She was brought to the ER where bloodwork and CT head were unremarkable. She denies any staring/unresponsive episodes, gaps in time, olfactory/gustatory hallucinations, focal numbness/tingling/weakness. She continues to have mild headaches on a daily basis, usually in the evenings, taking Tylenol every evening. She was previously taking Excedrin and states she stopped this. Vision rarely becomes blurred. She denies any dizziness. No side effects on Lamictal XR 200mg  daily. She continues to follow-up with her psychiatrist Dr. Evelene Croon, mood  overall stable.  History on Initial Assessment 04/23/2014: This is a pleasant 48 yo RH woman with a history of bipolar disorder, in her usual state of health until around November/December 2015 when she had a nocturnal seizure witnessed by her husband. She was stiff and shaking for 20-30 seconds, then went back to sleep. She woke up with her muscles sore, no focal weakness, no tongue bite or incontinence. It appears she did nto seek medical attention until after another seizure on 04/12/2014 in the morning while she was doing spelling words with her daughter. She recalls saying they had to get going, then she suddenly fell back shaking and moaning. Her daughter called her husband, and when she came to, he had come back home. Her daughter reports she was answering "no" to all the questions. She is amnestic of the event. She denies any prior warning symptoms. She denies any olfactory/gustatory hallucinations, deja vu, rising epigastric sensation, focal numbness/tingling/weakness, myoclonic jerks. She denies any alcohol or sleep deprivation prior to the seizures.   She had been taking Lamictal 200mg  qAM for 3-4 years but did not feel it was helping and tapered it off after discussing this with her psychiatrist. She had been completely off Lamictal for 2 months. She had asked to be restarted on Zoloft, which he had used for many years in the past. She had been taking Zoloft for 2 months when the seizure occurred. She takes Xanax XR 3mg  every morning since 2015, no change in dose, no missed doses. She has been taking Saphris for 3-4 years, which helps her sleep. For several years she has had mild 4/10 daily headaches that resolve after 30  minutes of taking Excedrin. There is no associated nausea, vomiting, photo/phonophobia, visual obscurations. She reports her memory is "horrible," she usually forgets what she went into a room for. She does not get lost driving, does not forget to take her medications.  Epilepsy  Risk Factors: She had a normal birth and early development. There is no history of febrile convulsions, CNS infections such as meningitis/encephalitis, significant traumatic brain injury, neurosurgical procedures, or family history of seizures.  Diagnostic Data: I personally reviewed MRI brain with and without contrast which did not show any acute changes. There was asymmetry noted in the temporal lobes, with temporal horn more prominent in the right than left. 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  Routine EEG showed excess beta activity from benzodiazepine use.  24-hour EEG had shown rare left frontal sharp waves exclusively in sleep.   Her trough Lamictal level in May 2016 was 3.6.    Observations/Objective:   Vitals:   08/01/18 0851  Weight: 130 lb (59 kg)  Height: 5\' 6"  (1.676 m)   GEN:  The patient appears stated age and is in NAD. Patient is awake, alert, oriented x 3. No aphasia or dysarthria. Intact fluency and comprehension. Remote and recent memory intact. Able to name and repeat. Cranial nerves: Extraocular movements intact with no nystagmus. No facial asymmetry. Motor: moves all extremities symmetrically, at least anti-gravity x 4. No incoordination on finger to nose testing. Gait: narrow-based and steady, able to tandem walk adequately. Negative Romberg test.  Assessment and Plan: This is a pleasant 48 yo RH woman with a history of bipolar disorder with rare generalized convulsions. Her 24-hour EEG had shown rare left frontal epileptiform discharges. MRI brain had shown hippocampal asymmetry, concerning for possible migrational abnormality in the left temporal lobe. 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 She had been seizure-free for 4 years until a GTC last 05/27/2018 in the setting  of missing 3 doses high dose Xanax. Most recent seizure likely due related to this. Continue Lamictal XR 200mg  daily, refills sent. We again discussed no driving after a seizure until 6 months seizure-free. She reports daily headaches suggestive of tension-type headaches and was advised to minimize OTC medication to 2-3 a week to avoid rebound headaches. Follow-up in 6-8 months, she knows to call for any changes.   Follow Up Instructions:   -I discussed the assessment and treatment plan with the patient. The patient was provided an opportunity to ask questions and all were answered. The patient agreed with the plan and demonstrated an understanding of the instructions.   The patient was advised to call back or seek an in-person evaluation if the symptoms worsen or if the condition fails to improve as anticipated.     Van ClinesKaren M Lochlyn Zullo, MD

## 2018-08-04 ENCOUNTER — Ambulatory Visit: Payer: BLUE CROSS/BLUE SHIELD | Admitting: Psychology

## 2018-08-14 ENCOUNTER — Ambulatory Visit (INDEPENDENT_AMBULATORY_CARE_PROVIDER_SITE_OTHER): Payer: BLUE CROSS/BLUE SHIELD | Admitting: Psychology

## 2018-08-14 DIAGNOSIS — F3132 Bipolar disorder, current episode depressed, moderate: Secondary | ICD-10-CM

## 2018-08-14 DIAGNOSIS — R351 Nocturia: Secondary | ICD-10-CM | POA: Diagnosis not present

## 2018-08-14 DIAGNOSIS — R350 Frequency of micturition: Secondary | ICD-10-CM | POA: Diagnosis not present

## 2018-08-29 ENCOUNTER — Ambulatory Visit: Payer: BLUE CROSS/BLUE SHIELD | Admitting: Psychology

## 2018-09-01 ENCOUNTER — Ambulatory Visit (INDEPENDENT_AMBULATORY_CARE_PROVIDER_SITE_OTHER): Payer: BC Managed Care – PPO | Admitting: Psychology

## 2018-09-01 DIAGNOSIS — F3132 Bipolar disorder, current episode depressed, moderate: Secondary | ICD-10-CM

## 2018-09-19 ENCOUNTER — Ambulatory Visit (INDEPENDENT_AMBULATORY_CARE_PROVIDER_SITE_OTHER): Payer: BC Managed Care – PPO | Admitting: Psychology

## 2018-09-19 DIAGNOSIS — F3132 Bipolar disorder, current episode depressed, moderate: Secondary | ICD-10-CM | POA: Diagnosis not present

## 2018-10-02 ENCOUNTER — Ambulatory Visit: Payer: BC Managed Care – PPO | Admitting: Psychology

## 2018-10-25 ENCOUNTER — Ambulatory Visit (INDEPENDENT_AMBULATORY_CARE_PROVIDER_SITE_OTHER): Payer: BC Managed Care – PPO | Admitting: Psychology

## 2018-10-25 DIAGNOSIS — F3132 Bipolar disorder, current episode depressed, moderate: Secondary | ICD-10-CM

## 2018-11-06 ENCOUNTER — Ambulatory Visit (INDEPENDENT_AMBULATORY_CARE_PROVIDER_SITE_OTHER): Payer: BC Managed Care – PPO | Admitting: Psychology

## 2018-11-06 DIAGNOSIS — F3132 Bipolar disorder, current episode depressed, moderate: Secondary | ICD-10-CM

## 2018-11-13 DIAGNOSIS — F3176 Bipolar disorder, in full remission, most recent episode depressed: Secondary | ICD-10-CM | POA: Diagnosis not present

## 2018-11-13 DIAGNOSIS — G4709 Other insomnia: Secondary | ICD-10-CM | POA: Diagnosis not present

## 2018-11-13 DIAGNOSIS — F900 Attention-deficit hyperactivity disorder, predominantly inattentive type: Secondary | ICD-10-CM | POA: Diagnosis not present

## 2018-11-13 DIAGNOSIS — F3174 Bipolar disorder, in full remission, most recent episode manic: Secondary | ICD-10-CM | POA: Diagnosis not present

## 2018-11-24 ENCOUNTER — Ambulatory Visit (INDEPENDENT_AMBULATORY_CARE_PROVIDER_SITE_OTHER): Payer: BC Managed Care – PPO | Admitting: Psychology

## 2018-11-24 DIAGNOSIS — F3132 Bipolar disorder, current episode depressed, moderate: Secondary | ICD-10-CM

## 2018-12-05 DIAGNOSIS — Z0000 Encounter for general adult medical examination without abnormal findings: Secondary | ICD-10-CM | POA: Diagnosis not present

## 2018-12-05 DIAGNOSIS — Z79899 Other long term (current) drug therapy: Secondary | ICD-10-CM | POA: Diagnosis not present

## 2018-12-13 ENCOUNTER — Ambulatory Visit (INDEPENDENT_AMBULATORY_CARE_PROVIDER_SITE_OTHER): Payer: BC Managed Care – PPO | Admitting: Psychology

## 2018-12-13 DIAGNOSIS — Z23 Encounter for immunization: Secondary | ICD-10-CM | POA: Diagnosis not present

## 2018-12-13 DIAGNOSIS — R7989 Other specified abnormal findings of blood chemistry: Secondary | ICD-10-CM | POA: Diagnosis not present

## 2018-12-13 DIAGNOSIS — R7301 Impaired fasting glucose: Secondary | ICD-10-CM | POA: Diagnosis not present

## 2018-12-13 DIAGNOSIS — F3132 Bipolar disorder, current episode depressed, moderate: Secondary | ICD-10-CM

## 2018-12-13 DIAGNOSIS — Z0000 Encounter for general adult medical examination without abnormal findings: Secondary | ICD-10-CM | POA: Diagnosis not present

## 2018-12-15 DIAGNOSIS — L821 Other seborrheic keratosis: Secondary | ICD-10-CM | POA: Diagnosis not present

## 2018-12-20 DIAGNOSIS — R82998 Other abnormal findings in urine: Secondary | ICD-10-CM | POA: Diagnosis not present

## 2018-12-20 DIAGNOSIS — R7301 Impaired fasting glucose: Secondary | ICD-10-CM | POA: Diagnosis not present

## 2018-12-20 DIAGNOSIS — Z1331 Encounter for screening for depression: Secondary | ICD-10-CM | POA: Diagnosis not present

## 2018-12-20 DIAGNOSIS — F319 Bipolar disorder, unspecified: Secondary | ICD-10-CM | POA: Diagnosis not present

## 2018-12-20 DIAGNOSIS — R569 Unspecified convulsions: Secondary | ICD-10-CM | POA: Diagnosis not present

## 2018-12-20 DIAGNOSIS — R630 Anorexia: Secondary | ICD-10-CM | POA: Diagnosis not present

## 2018-12-20 DIAGNOSIS — Z0000 Encounter for general adult medical examination without abnormal findings: Secondary | ICD-10-CM | POA: Diagnosis not present

## 2018-12-21 DIAGNOSIS — Z1212 Encounter for screening for malignant neoplasm of rectum: Secondary | ICD-10-CM | POA: Diagnosis not present

## 2018-12-24 ENCOUNTER — Other Ambulatory Visit: Payer: Self-pay | Admitting: Neurology

## 2018-12-24 DIAGNOSIS — G40209 Localization-related (focal) (partial) symptomatic epilepsy and epileptic syndromes with complex partial seizures, not intractable, without status epilepticus: Secondary | ICD-10-CM

## 2018-12-29 ENCOUNTER — Ambulatory Visit: Payer: BC Managed Care – PPO | Admitting: Psychology

## 2019-01-01 ENCOUNTER — Ambulatory Visit: Payer: Self-pay | Admitting: Neurology

## 2019-01-02 ENCOUNTER — Other Ambulatory Visit: Payer: Self-pay

## 2019-01-02 ENCOUNTER — Telehealth (INDEPENDENT_AMBULATORY_CARE_PROVIDER_SITE_OTHER): Payer: BC Managed Care – PPO | Admitting: Neurology

## 2019-01-02 ENCOUNTER — Encounter: Payer: Self-pay | Admitting: Neurology

## 2019-01-02 VITALS — Ht 67.0 in | Wt 127.0 lb

## 2019-01-02 DIAGNOSIS — G44209 Tension-type headache, unspecified, not intractable: Secondary | ICD-10-CM

## 2019-01-02 DIAGNOSIS — G40209 Localization-related (focal) (partial) symptomatic epilepsy and epileptic syndromes with complex partial seizures, not intractable, without status epilepticus: Secondary | ICD-10-CM | POA: Diagnosis not present

## 2019-01-02 MED ORDER — LAMOTRIGINE ER 200 MG PO TB24: 1.0000 | ORAL_TABLET | Freq: Every day | ORAL | 3 refills | Status: DC

## 2019-01-02 NOTE — Progress Notes (Signed)
Virtual Visit via Video Note The purpose of this virtual visit is to provide medical care while limiting exposure to the novel coronavirus.    Consent was obtained for video visit:  Yes.   Answered questions that patient had about telehealth interaction:  Yes.   I discussed the limitations, risks, security and privacy concerns of performing an evaluation and management service by telemedicine. I also discussed with the patient that there may be a patient responsible charge related to this service. The patient expressed understanding and agreed to proceed.  Pt location: Home Physician Location: office Name of referring provider:  Martha ClanShaw, William, MD I connected with Sherry Tate at patients initiation/request on 01/02/2019 at  2:30 PM EDT by video enabled telemedicine application and verified that I am speaking with the correct person using two identifiers. Pt MRN:  [id number] Pt DOB:  [birth year] Video Participants:  Sherry Tate   History of Present Illness:  The patient was seen as a virtual video visit on 01/02/2019. She was last seen 5 months ago after she had a breakthrough seizure on 05/27/2018 after stopping Xanax 3mg  BID suddenly. She has been back to taking all her medications without any further seizures or seizure-like symptoms. She is on Lamotrigine ER 200mg  daily without side effects. She denies any staring/unresponsive episodes, gaps in time, olfactory/gustatory hallucinations, focal numbness/tingling/weakness, myoclonic jerks. She was reporting daily headaches and was taking OTC medication daily. She states the headaches are better, she has cut down on OTC intake, skipping 2-3 days instead of taking it daily. Mood is very good, her psychiatrist adjusted her medications and this is working well. She has also started melatonin which is helping sleep better. No falls.   History on Initial Assessment 04/23/2014: This is a pleasant 48 yo RH woman with a history of bipolar disorder, in  her usual state of health until around November/December 2015 when she had a nocturnal seizure witnessed by her husband. She was stiff and shaking for 20-30 seconds, then went back to sleep. She woke up with her muscles sore, no focal weakness, no tongue bite or incontinence. It appears she did nto seek medical attention until after another seizure on 04/12/2014 in the morning while she was doing spelling words with her daughter. She recalls saying they had to get going, then she suddenly fell back shaking and moaning. Her daughter called her husband, and when she came to, he had come back home. Her daughter reports she was answering "no" to all the questions. She is amnestic of the event. She denies any prior warning symptoms. She denies any olfactory/gustatory hallucinations, deja vu, rising epigastric sensation, focal numbness/tingling/weakness, myoclonic jerks. She denies any alcohol or sleep deprivation prior to the seizures.   She had been taking Lamictal 200mg  qAM for 3-4 years but did not feel it was helping and tapered it off after discussing this with her psychiatrist. She had been completely off Lamictal for 2 months. She had asked to be restarted on Zoloft, which he had used for many years in the past. She had been taking Zoloft for 2 months when the seizure occurred. She takes Xanax XR 3mg  every morning since 2015, no change in dose, no missed doses. She has been taking Saphris for 3-4 years, which helps her sleep. For several years she has had mild 4/10 daily headaches that resolve after 30 minutes of taking Excedrin. There is no associated nausea, vomiting, photo/phonophobia, visual obscurations. She reports her memory is "horrible," she usually forgets what she  went into a room for. She does not get lost driving, does not forget to take her medications.  Epilepsy Risk Factors: She had a normal birth and early development. There is no history of febrile convulsions, CNS infections such as  meningitis/encephalitis, significant traumatic brain injury, neurosurgical procedures, or family history of seizures.  Diagnostic Data: I personally reviewed MRI brain with and without contrast which did not show any acute changes. There was asymmetry noted in the temporal lobes, with temporal horn more prominent in the right than left. 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  Routine EEG showed excess beta activity from benzodiazepine use.  24-hour EEG had shown rare left frontal sharp waves exclusively in sleep.   Her trough Lamictal level in May 2016 was 3.6.     Current Outpatient Medications on File Prior to Visit  Medication Sig Dispense Refill  . ADDERALL XR 30 MG 24 hr capsule Take 30 mg by mouth 2 (two) times daily.   0  . ALPRAZolam (XANAX XR) 3 MG 24 hr tablet Take 3 mg by mouth 2 (two) times daily.     Marland Kitchen BIOTIN PO Take 1 tablet by mouth daily.    . LamoTRIgine 200 MG TB24 24 hour tablet TAKE 1 TABLET DAILY 90 tablet 3  . LINZESS 145 MCG CAPS capsule Take 145 mcg by mouth daily.    . QUEtiapine (SEROQUEL) 300 MG tablet Take 300 mg by mouth daily.     . QUEtiapine Fumarate (SEROQUEL PO) Take 150 mg by mouth daily.    . ziprasidone (GEODON) 60 MG capsule Take 60 mg by mouth 2 (two) times daily with a meal.      No current facility-administered medications on file prior to visit.      Observations/Objective:   Vitals:   01/02/19 1301  Weight: 127 lb (57.6 kg)  Height: 5\' 7"  (1.702 m)   GEN:  The patient appears stated age and is in NAD.  Neurological examination: Patient is awake, alert, oriented x 3. No aphasia or dysarthria. Intact fluency and comprehension. Remote and recent memory intact. Able to name and repeat. Cranial nerves: Extraocular movements intact with no nystagmus. No facial asymmetry. Motor: moves all extremities symmetrically,  at least anti-gravity x 4. No incoordination on finger to nose testing. Gait: narrow-based and steady, able to tandem walk adequately. Negative Romberg test.   Assessment and Plan:   This is a pleasant 48 yo RH woman with a history of bipolar disorder with rare generalized convulsions. Her 24-hour EEG had shown rare left frontal epileptiform discharges. MRI brain had shown hippocampal asymmetry, concerning for possible migrational abnormality in the left temporal lobe. 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 No further seizures since 04/2018 (triggered by stopping Xanax suddenly). Prior to this she was 4 years seizure-free on lamotrigine ER 200mg  daily. Continue current dose of Lamotrigine, refills sent. Continue follow-up with psychiatry, she knows to wean off Xanax in the future if necessary. Headaches are better with reduction in OTC medication, she knows to minimize to 2-3 a week to avoid rebound headaches. She is aware of [place] driving laws to stop driving until 6 months seizure-free. Follow-up in 1 year, she knows to call for any changes.    Follow Up Instructions:   -I discussed the assessment and treatment plan with the patient. The  patient was provided an opportunity to ask questions and all were answered. The patient agreed with the plan and demonstrated an understanding of the instructions.   The patient was advised to call back or seek an in-person evaluation if the symptoms worsen or if the condition fails to improve as anticipated.    Van Clines, MD

## 2019-01-05 DIAGNOSIS — D1801 Hemangioma of skin and subcutaneous tissue: Secondary | ICD-10-CM | POA: Diagnosis not present

## 2019-01-05 DIAGNOSIS — L650 Telogen effluvium: Secondary | ICD-10-CM | POA: Diagnosis not present

## 2019-04-09 ENCOUNTER — Telehealth: Payer: Self-pay | Admitting: Neurology

## 2019-04-09 NOTE — Telephone Encounter (Signed)
Patient is scheduled for 04/23/19 at 8:30AM- she couldn't do the 14th because of another doctors appt. Just FYI. Thanks!

## 2019-04-09 NOTE — Telephone Encounter (Signed)
I have a Jan 14, 11am opening, if she can do that. Thanks

## 2019-04-09 NOTE — Telephone Encounter (Signed)
Dr. Karel Jarvis,  Is this pt ok to wait until your next available? She is on the schedule currently for a follow up for her seizures in October.

## 2019-04-09 NOTE — Telephone Encounter (Addendum)
Patient called and left a message stating she is experiencing memory loss. She'd like an appointment with Dr. Karel Jarvis sometime in the morning, if available.

## 2019-04-12 DIAGNOSIS — Z01419 Encounter for gynecological examination (general) (routine) without abnormal findings: Secondary | ICD-10-CM | POA: Diagnosis not present

## 2019-04-12 DIAGNOSIS — Z1231 Encounter for screening mammogram for malignant neoplasm of breast: Secondary | ICD-10-CM | POA: Diagnosis not present

## 2019-04-12 DIAGNOSIS — Z6820 Body mass index (BMI) 20.0-20.9, adult: Secondary | ICD-10-CM | POA: Diagnosis not present

## 2019-04-12 DIAGNOSIS — Z130 Encounter for screening for diseases of the blood and blood-forming organs and certain disorders involving the immune mechanism: Secondary | ICD-10-CM | POA: Diagnosis not present

## 2019-04-12 DIAGNOSIS — N898 Other specified noninflammatory disorders of vagina: Secondary | ICD-10-CM | POA: Diagnosis not present

## 2019-04-18 ENCOUNTER — Other Ambulatory Visit: Payer: Self-pay | Admitting: Gynecology

## 2019-04-18 DIAGNOSIS — R928 Other abnormal and inconclusive findings on diagnostic imaging of breast: Secondary | ICD-10-CM

## 2019-04-20 ENCOUNTER — Encounter: Payer: Self-pay | Admitting: Neurology

## 2019-04-23 ENCOUNTER — Other Ambulatory Visit: Payer: Self-pay

## 2019-04-23 ENCOUNTER — Telehealth (INDEPENDENT_AMBULATORY_CARE_PROVIDER_SITE_OTHER): Payer: BC Managed Care – PPO | Admitting: Neurology

## 2019-04-23 VITALS — Ht 66.0 in | Wt 124.0 lb

## 2019-04-23 DIAGNOSIS — R413 Other amnesia: Secondary | ICD-10-CM

## 2019-04-23 DIAGNOSIS — G40209 Localization-related (focal) (partial) symptomatic epilepsy and epileptic syndromes with complex partial seizures, not intractable, without status epilepticus: Secondary | ICD-10-CM | POA: Diagnosis not present

## 2019-04-23 NOTE — Progress Notes (Signed)
Virtual Visit via Video Note The purpose of this virtual visit is to provide medical care while limiting exposure to the novel coronavirus.    Consent was obtained for video visit:  Yes.   Answered questions that patient had about telehealth interaction:  Yes.   I discussed the limitations, risks, security and privacy concerns of performing an evaluation and management service by telemedicine. I also discussed with the patient that there may be a patient responsible charge related to this service. The patient expressed understanding and agreed to proceed.  Pt location: Home Physician Location: office Name of referring provider:  Marton Redwood, MD I connected with Sherry Tate at patients initiation/request on 04/23/2019 at  8:30 AM EST by video enabled telemedicine application and verified that I am speaking with the correct person using two identifiers. Pt MRN:  [id number] Pt DOB:  [birth year] Video Participants:  Sherry Tate   History of Present Illness:  The patient was seen as a virtual video visit on 04/23/2019. She was last seen 3 months ago in the neurology clinic for seizures. She has been doing well from a seizure-standpoint, no seizures since 04/2018 when she stopped Xanax 3mg  BID suddenly. She is on Lamotrigine ER 200mg  daily without side effects. She presents for an earlier visit due to memory loss. She has noticed her short-term memory is terrible. She thinks of something then forgets it. It does come back 1-2 minutes later, but it is frustrating since it happens "all day everyday." She denies getting lost driving. She has to write everything down and keeps a timer on her phone as a reminder. She knows that some of her medications can cause cognitive issues, she has slowly cut down on the Xanax 3mg , taking 1 tab in AM, 1/2 tab in PM. She would ideally like to be on 1 tab daily. She is also on Quetiapine 450mg  qhs and ziprasidone 60mg  BID. She smokes marijuana but does not think  this is causing memory issues. Sleep is overall okay. She denies any further headaches since stopping Excedrin migraine intake, she does not recall the last time she took it. Mood stable on current regimen, she follows up regularly with her psychiatrist.  History on Initial Assessment 04/23/2014: This is a pleasant 49 yo RH woman with a history of bipolar disorder, in her usual state of health until around November/December 2015 when she had a nocturnal seizure witnessed by her husband. She was stiff and shaking for 20-30 seconds, then went back to sleep. She woke up with her muscles sore, no focal weakness, no tongue bite or incontinence. It appears she did nto seek medical attention until after another seizure on 04/12/2014 in the morning while she was doing spelling words with her daughter. She recalls saying they had to get going, then she suddenly fell back shaking and moaning. Her daughter called her husband, and when she came to, he had come back home. Her daughter reports she was answering "no" to all the questions. She is amnestic of the event. She denies any prior warning symptoms. She denies any olfactory/gustatory hallucinations, deja vu, rising epigastric sensation, focal numbness/tingling/weakness, myoclonic jerks. She denies any alcohol or sleep deprivation prior to the seizures.   She had been taking Lamictal 200mg  qAM for 3-4 years but did not feel it was helping and tapered it off after discussing this with her psychiatrist. She had been completely off Lamictal for 2 months. She had asked to be restarted on Zoloft, which he had used for  many years in the past. She had been taking Zoloft for 2 months when the seizure occurred. She takes Xanax XR 3mg  every morning since 2015, no change in dose, no missed doses. She has been taking Saphris for 3-4 years, which helps her sleep. For several years she has had mild 4/10 daily headaches that resolve after 30 minutes of taking Excedrin. There is no  associated nausea, vomiting, photo/phonophobia, visual obscurations. She reports her memory is "horrible," she usually forgets what she went into a room for. She does not get lost driving, does not forget to take her medications.  Epilepsy Risk Factors: She had a normal birth and early development. There is no history of febrile convulsions, CNS infections such as meningitis/encephalitis, significant traumatic brain injury, neurosurgical procedures, or family history of seizures.  Diagnostic Data: I personally reviewed MRI brain with and without contrast which did not show any acute changes. There was asymmetry noted in the temporal lobes, with temporal horn more prominent in the right than left. 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  Routine EEG showed excess beta activity from benzodiazepine use.  24-hour EEG had shown rare left frontal sharp waves exclusively in sleep.   Her trough Lamictal level in May 2016 was 3.6.    Current Outpatient Medications on File Prior to Visit  Medication Sig Dispense Refill  . ADDERALL XR 30 MG 24 hr capsule Take 30 mg by mouth 2 (two) times daily.   0  . ALPRAZolam (XANAX XR) 3 MG 24 hr tablet Take 3 mg by mouth 2 (two) times daily.     June 2016 BIOTIN PO Take 1 tablet by mouth daily.    Marland Kitchen FERREX 150 150 MG capsule Take 150 mg by mouth daily.    . LamoTRIgine 200 MG TB24 24 hour tablet Take 1 tablet (200 mg total) by mouth daily. 90 tablet 3  . LINZESS 145 MCG CAPS capsule Take 145 mcg by mouth daily.    . QUEtiapine (SEROQUEL) 300 MG tablet Take 300 mg by mouth daily.     . QUEtiapine Fumarate (SEROQUEL PO) Take 150 mg by mouth daily.    . ziprasidone (GEODON) 60 MG capsule Take 60 mg by mouth 2 (two) times daily with a meal.      No current facility-administered medications on file prior to visit.     Observations/Objective:    Vitals:   04/20/19 1444  Weight: 124 lb (56.2 kg)  Height: 5\' 6"  (1.676 m)   GEN:  The patient appears stated age and is in NAD.  Neurological examination: Patient is awake, alert, oriented x 3. No aphasia or dysarthria. Intact fluency and comprehension. Remote and recent memory intact.SLUMS score 23/30 [employer] Mental Exam 04/23/2019  Weekday Correct 1  Current year 1  What state are we in? 1  Amount spent 0  Amount left 0  # of Animals 3  5 objects recall 5  Number series 2  Hour markers 2  Time correct 2  Placed X in triangle correctly 1  Largest Figure 1  Name of female 2  Date back to work 0  Type of work 0  State she lived in 2  Total score 23   Cranial nerves: Extraocular movements intact with no nystagmus. No facial asymmetry. Motor: moves all extremities symmetrically, at least anti-gravity x 4.   Assessment and Plan:  This is a pleasant 50 yo RH woman with a history of bipolar disorder with rare generalized convulsions. Her 24-hour EEG had shown rare left frontal epileptiform discharges. MRI brain had shown hippocampal asymmetry, concerning for possible migrational abnormality in the left temporal lobe. 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 She has been seizure-free since 04/2018 (benzodiazepine withdrawal seizure due to suddenly stopping Xanax). Prior to this, she had been 4 years seizure-free on Lamotrigine ER 200mg  daily. Her main concern today is memory loss, SLUMS score 23/30. We discussed different causes of memory changes, check TSH and B12. She is taking several medications which can also affect cognition, she would like to safely reduce dose of Xanax, instructed not to stop it suddenly. She will reduce every dose to 1/2 tab every other night, then stop. Continue taking 1 tab every morning. We discussed quetiapine and ziprasidone have also been reported to affect cognition, but agree that  benefits of medications outweigh memory side effects. She was also advised to stop marijuana smoking. We discussed the importance of control of vascular risk factors, physical exercise, and brain stimulation exercises for brain health.She is aware of  driving laws to stop driving until 6 months seizure-free. Follow-up as scheduled in October 2021, she knows to call for any changes.    Follow Up Instructions:    -I discussed the assessment and treatment plan with the patient. The patient was provided an opportunity to ask questions and all were answered. The patient agreed with the plan and demonstrated an understanding of the instructions.   The patient was advised to call back or seek an in-person evaluation if the symptoms worsen or if the condition fails to improve as anticipated.     06-23-1968, MD

## 2019-04-25 ENCOUNTER — Other Ambulatory Visit: Payer: BC Managed Care – PPO

## 2019-04-26 ENCOUNTER — Ambulatory Visit
Admission: RE | Admit: 2019-04-26 | Discharge: 2019-04-26 | Disposition: A | Discharge status: Home / Self Care | Payer: BC Managed Care – PPO | Source: Ambulatory Visit | Attending: Gynecology | Admitting: Gynecology

## 2019-04-26 ENCOUNTER — Other Ambulatory Visit: Payer: Self-pay

## 2019-04-26 DIAGNOSIS — R928 Other abnormal and inconclusive findings on diagnostic imaging of breast: Secondary | ICD-10-CM

## 2019-04-26 DIAGNOSIS — E611 Iron deficiency: Secondary | ICD-10-CM | POA: Diagnosis not present

## 2019-04-26 DIAGNOSIS — R922 Inconclusive mammogram: Secondary | ICD-10-CM | POA: Diagnosis not present

## 2019-04-26 DIAGNOSIS — N6489 Other specified disorders of breast: Secondary | ICD-10-CM | POA: Diagnosis not present

## 2019-04-26 DIAGNOSIS — Z0000 Encounter for general adult medical examination without abnormal findings: Secondary | ICD-10-CM | POA: Diagnosis not present

## 2019-04-26 DIAGNOSIS — R5382 Chronic fatigue, unspecified: Secondary | ICD-10-CM | POA: Diagnosis not present

## 2019-04-26 DIAGNOSIS — R7989 Other specified abnormal findings of blood chemistry: Secondary | ICD-10-CM | POA: Diagnosis not present

## 2019-04-26 DIAGNOSIS — Z79899 Other long term (current) drug therapy: Secondary | ICD-10-CM | POA: Diagnosis not present

## 2019-05-09 DIAGNOSIS — F900 Attention-deficit hyperactivity disorder, predominantly inattentive type: Secondary | ICD-10-CM | POA: Diagnosis not present

## 2019-05-09 DIAGNOSIS — G4709 Other insomnia: Secondary | ICD-10-CM | POA: Diagnosis not present

## 2019-07-13 DIAGNOSIS — R413 Other amnesia: Secondary | ICD-10-CM | POA: Diagnosis not present

## 2019-07-13 DIAGNOSIS — G2581 Restless legs syndrome: Secondary | ICD-10-CM | POA: Diagnosis not present

## 2019-07-13 DIAGNOSIS — R859 Unspecified abnormal finding in specimens from digestive organs and abdominal cavity: Secondary | ICD-10-CM | POA: Diagnosis not present

## 2019-07-13 DIAGNOSIS — B351 Tinea unguium: Secondary | ICD-10-CM | POA: Diagnosis not present

## 2019-07-13 DIAGNOSIS — E611 Iron deficiency: Secondary | ICD-10-CM | POA: Diagnosis not present

## 2019-08-09 DIAGNOSIS — G4709 Other insomnia: Secondary | ICD-10-CM | POA: Diagnosis not present

## 2019-08-09 DIAGNOSIS — F3174 Bipolar disorder, in full remission, most recent episode manic: Secondary | ICD-10-CM | POA: Diagnosis not present

## 2019-08-09 DIAGNOSIS — F3176 Bipolar disorder, in full remission, most recent episode depressed: Secondary | ICD-10-CM | POA: Diagnosis not present

## 2019-10-26 DIAGNOSIS — R569 Unspecified convulsions: Secondary | ICD-10-CM | POA: Diagnosis not present

## 2019-10-26 DIAGNOSIS — Z79899 Other long term (current) drug therapy: Secondary | ICD-10-CM | POA: Diagnosis not present

## 2019-10-31 DIAGNOSIS — F900 Attention-deficit hyperactivity disorder, predominantly inattentive type: Secondary | ICD-10-CM | POA: Diagnosis not present

## 2019-10-31 DIAGNOSIS — F3176 Bipolar disorder, in full remission, most recent episode depressed: Secondary | ICD-10-CM | POA: Diagnosis not present

## 2019-10-31 DIAGNOSIS — F3174 Bipolar disorder, in full remission, most recent episode manic: Secondary | ICD-10-CM | POA: Diagnosis not present

## 2019-12-14 DIAGNOSIS — Z79899 Other long term (current) drug therapy: Secondary | ICD-10-CM | POA: Diagnosis not present

## 2019-12-14 DIAGNOSIS — R946 Abnormal results of thyroid function studies: Secondary | ICD-10-CM | POA: Diagnosis not present

## 2019-12-14 DIAGNOSIS — R7301 Impaired fasting glucose: Secondary | ICD-10-CM | POA: Diagnosis not present

## 2019-12-14 DIAGNOSIS — Z0000 Encounter for general adult medical examination without abnormal findings: Secondary | ICD-10-CM | POA: Diagnosis not present

## 2019-12-16 ENCOUNTER — Other Ambulatory Visit: Payer: Self-pay | Admitting: Neurology

## 2019-12-16 DIAGNOSIS — G40209 Localization-related (focal) (partial) symptomatic epilepsy and epileptic syndromes with complex partial seizures, not intractable, without status epilepticus: Secondary | ICD-10-CM

## 2019-12-21 DIAGNOSIS — R7301 Impaired fasting glucose: Secondary | ICD-10-CM | POA: Diagnosis not present

## 2019-12-21 DIAGNOSIS — E611 Iron deficiency: Secondary | ICD-10-CM | POA: Diagnosis not present

## 2019-12-21 DIAGNOSIS — R82998 Other abnormal findings in urine: Secondary | ICD-10-CM | POA: Diagnosis not present

## 2019-12-21 DIAGNOSIS — Z23 Encounter for immunization: Secondary | ICD-10-CM | POA: Diagnosis not present

## 2019-12-21 DIAGNOSIS — Z1331 Encounter for screening for depression: Secondary | ICD-10-CM | POA: Diagnosis not present

## 2019-12-21 DIAGNOSIS — Z0000 Encounter for general adult medical examination without abnormal findings: Secondary | ICD-10-CM | POA: Diagnosis not present

## 2019-12-25 DIAGNOSIS — Z1212 Encounter for screening for malignant neoplasm of rectum: Secondary | ICD-10-CM | POA: Diagnosis not present

## 2020-01-02 ENCOUNTER — Ambulatory Visit: Payer: BC Managed Care – PPO | Admitting: Neurology

## 2020-01-08 ENCOUNTER — Other Ambulatory Visit: Payer: Self-pay | Admitting: Neurology

## 2020-01-08 DIAGNOSIS — G40209 Localization-related (focal) (partial) symptomatic epilepsy and epileptic syndromes with complex partial seizures, not intractable, without status epilepticus: Secondary | ICD-10-CM

## 2020-02-06 ENCOUNTER — Other Ambulatory Visit: Payer: Self-pay | Admitting: Neurology

## 2020-02-06 DIAGNOSIS — G40209 Localization-related (focal) (partial) symptomatic epilepsy and epileptic syndromes with complex partial seizures, not intractable, without status epilepticus: Secondary | ICD-10-CM

## 2020-04-23 DIAGNOSIS — Z130 Encounter for screening for diseases of the blood and blood-forming organs and certain disorders involving the immune mechanism: Secondary | ICD-10-CM | POA: Diagnosis not present

## 2020-04-23 DIAGNOSIS — E785 Hyperlipidemia, unspecified: Secondary | ICD-10-CM | POA: Diagnosis not present

## 2020-04-25 DIAGNOSIS — F410 Panic disorder [episodic paroxysmal anxiety] without agoraphobia: Secondary | ICD-10-CM | POA: Diagnosis not present

## 2020-04-25 DIAGNOSIS — F3174 Bipolar disorder, in full remission, most recent episode manic: Secondary | ICD-10-CM | POA: Diagnosis not present

## 2020-04-25 DIAGNOSIS — F3176 Bipolar disorder, in full remission, most recent episode depressed: Secondary | ICD-10-CM | POA: Diagnosis not present

## 2020-04-25 DIAGNOSIS — G4700 Insomnia, unspecified: Secondary | ICD-10-CM | POA: Diagnosis not present

## 2020-05-13 DIAGNOSIS — Z6820 Body mass index (BMI) 20.0-20.9, adult: Secondary | ICD-10-CM | POA: Diagnosis not present

## 2020-05-13 DIAGNOSIS — Z124 Encounter for screening for malignant neoplasm of cervix: Secondary | ICD-10-CM | POA: Diagnosis not present

## 2020-05-13 DIAGNOSIS — Z1151 Encounter for screening for human papillomavirus (HPV): Secondary | ICD-10-CM | POA: Diagnosis not present

## 2020-05-13 DIAGNOSIS — N841 Polyp of cervix uteri: Secondary | ICD-10-CM | POA: Diagnosis not present

## 2020-05-13 DIAGNOSIS — N898 Other specified noninflammatory disorders of vagina: Secondary | ICD-10-CM | POA: Diagnosis not present

## 2020-05-13 DIAGNOSIS — Z1231 Encounter for screening mammogram for malignant neoplasm of breast: Secondary | ICD-10-CM | POA: Diagnosis not present

## 2020-05-13 DIAGNOSIS — Z01419 Encounter for gynecological examination (general) (routine) without abnormal findings: Secondary | ICD-10-CM | POA: Diagnosis not present

## 2020-08-29 ENCOUNTER — Ambulatory Visit (INDEPENDENT_AMBULATORY_CARE_PROVIDER_SITE_OTHER): Payer: BC Managed Care – PPO | Admitting: Neurology

## 2020-08-29 ENCOUNTER — Other Ambulatory Visit: Payer: Self-pay

## 2020-08-29 ENCOUNTER — Encounter: Payer: Self-pay | Admitting: Neurology

## 2020-08-29 VITALS — BP 102/64 | HR 94 | Ht 66.0 in | Wt 125.0 lb

## 2020-08-29 DIAGNOSIS — R29898 Other symptoms and signs involving the musculoskeletal system: Secondary | ICD-10-CM

## 2020-08-29 DIAGNOSIS — G40209 Localization-related (focal) (partial) symptomatic epilepsy and epileptic syndromes with complex partial seizures, not intractable, without status epilepticus: Secondary | ICD-10-CM

## 2020-08-29 MED ORDER — LAMOTRIGINE ER 200 MG PO TB24: 1.0000 | ORAL_TABLET | Freq: Every day | ORAL | 3 refills | Status: DC

## 2020-08-29 NOTE — Progress Notes (Signed)
NEUROLOGY FOLLOW UP OFFICE NOTE  Sherry Tate [id number] 10/06/1970  HISTORY OF PRESENT ILLNESS: I had the pleasure of seeing Sherry Tate in follow-up in the neurology clinic on 08/29/2020.  The patient was last seen over a year ago for seizures. Records and images were personally reviewed where available.  Since her last visit, she continues to do well seizure-free since 04/2018 when she suddenly stopped Xanax 3mg  BID. Prior to this, she had been seizure-free for 4 years. She continues on Lamotrigine ER 200mg  daily without side effects. She denies any staring/unresponsive episodes, gaps in time, olfactory/gustatory hallucinations, myoclonic jerks. She is happy to report that she had not had any headaches since stopping regular Excedrin intake. She had to take Tylenol for elbow pain, around 1.5 weeks ago after sending her sister a long text message, she noticed weakness in both elbows and right wrist. She has been wearing elastic support bandages which helps. There is no numbness/tingling but they still feel weak. She denies any neck pain. She denies any dizziness, vision changes, no falls. Sleep is great with her current regimen. She was previously reporting memory changes, she has been able to reduce Xanax to 3mg  qhs. Memory is the same, "not great but liveable."    History on Initial Assessment 04/23/2014: This is a pleasant 50 yo RH woman with a history of bipolar disorder, in her usual state of health until around November/December 2015 when she had a nocturnal seizure witnessed by her husband. She was stiff and shaking for 20-30 seconds, then went back to sleep. She woke up with her muscles sore, no focal weakness, no tongue bite or incontinence. It appears she did nto seek medical attention until after another seizure on 04/12/2014 in the morning while she was doing spelling words with her daughter. She recalls saying they had to get going, then she suddenly fell back shaking and moaning. Her  daughter called her husband, and when she came to, he had come back home. Her daughter reports she was answering "no" to all the questions. She is amnestic of the event. She denies any prior warning symptoms. She denies any olfactory/gustatory hallucinations, deja vu, rising epigastric sensation, focal numbness/tingling/weakness, myoclonic jerks. She denies any alcohol or sleep deprivation prior to the seizures.   She had been taking Lamictal 200mg  qAM for 3-4 years but did not feel it was helping and tapered it off after discussing this with her psychiatrist. She had been completely off Lamictal for 2 months. She had asked to be restarted on Zoloft, which he had used for many years in the past. She had been taking Zoloft for 2 months when the seizure occurred. She takes Xanax XR 3mg  every morning since 2015, no change in dose, no missed doses. She has been taking Saphris for 3-4 years, which helps her sleep. For several years she has had mild 4/10 daily headaches that resolve after 30 minutes of taking Excedrin. There is no associated nausea, vomiting, photo/phonophobia, visual obscurations. She reports her memory is "horrible," she usually forgets what she went into a room for. She does not get lost driving, does not forget to take her medications.  Epilepsy Risk Factors: She had a normal birth and early development. There is no history of febrile convulsions, CNS infections such as meningitis/encephalitis, significant traumatic brain injury, neurosurgical procedures, or family history of seizures.  Diagnostic Data: I personally reviewed MRI brain with and without contrast which did not show any acute changes. There was asymmetry noted in the  temporal lobes, with temporal horn more prominent in the right than left. 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  Routine EEG  showed excess beta activity from benzodiazepine use.  24-hour EEG had shown rare left frontal sharp waves exclusively in sleep.   Her trough Lamictal level in May 2016 was 3.6. PAST MEDICAL HISTORY: Past Medical History:  Diagnosis Date  . Bipolar disorder (HCC)   . Seizures (HCC)     MEDICATIONS: Current Outpatient Medications on File Prior to Visit  Medication Sig Dispense Refill  . ADDERALL XR 30 MG 24 hr capsule Take 30 mg by mouth 2 (two) times daily.   0  . ALPRAZolam (XANAX XR) 3 MG 24 hr tablet Take 3 mg by mouth 2 (two) times daily.     Marland Kitchen BIOTIN PO Take 1 tablet by mouth daily.    Marland Kitchen FERREX 150 150 MG capsule Take 150 mg by mouth daily.    . LamoTRIgine 200 MG TB24 24 hour tablet TAKE 1 TABLET BY MOUTH EVERY DAY 90 tablet 1  . LINZESS 145 MCG CAPS capsule Take 145 mcg by mouth daily.    . Melatonin 10 MG TABS Take by mouth.    . QUEtiapine (SEROQUEL) 300 MG tablet Take 300 mg by mouth daily.     . QUEtiapine Fumarate (SEROQUEL PO) Take 150 mg by mouth daily.    . ziprasidone (GEODON) 60 MG capsule Take 60 mg by mouth daily.     No current facility-administered medications on file prior to visit.    ALLERGIES: Allergies  Allergen Reactions  . Sulfa Antibiotics Other (See Comments)    jaundice    FAMILY HISTORY: Family History  Problem Relation Age of Onset  . Dementia Mother     SOCIAL HISTORY: Social History   Socioeconomic History  . Marital status: Married    Spouse name: Not on file  . Number of children: 2  . Years of education: Not on file  . Highest education level: Not on file  Occupational History  . Not on file  Tobacco Use  . Smoking status: Former Games developer  . Smokeless tobacco: Never Used  Vaping Use  . Vaping Use: Never used  Substance and Sexual Activity  . Alcohol use: No    Alcohol/week: 0.0 standard drinks  . Drug use: No  . Sexual activity: Yes    Partners: Male  Other Topics Concern  . Not on file  Social History Narrative    Right handed      Highest level of [employer]      Lives with husband two kids      Two story home   Social Determinants of Health   Financial Resource Strain: Not on file  Food Insecurity: Not on file  Transportation Needs: Not on file  Physical Activity: Not on file  Stress: Not on file  Social Connections: Not on file  Intimate Partner Violence: Not on file     PHYSICAL EXAM: Vitals:   08/29/20 0824  BP: 102/64  Pulse: 94  SpO2: 99%   General: No acute distress Head:  Normocephalic/atraumatic Skin/Extremities: No rash, no edema Neurological Exam: alert and awake. No aphasia or dysarthria. Fund of knowledge is appropriate.  Recent and remote memory are intact.  Attention and concentration are normal.   Cranial nerves: Pupils equal, round. Extraocular movements intact with no nystagmus.  Visual fields full.  No facial asymmetry.  Motor: Bulk and tone normal, muscle strength 5/5 throughout with no pronator drift. Sensation intact to temperature. Reflexes +2 on both UE, +1 both LE.  Finger to nose testing intact.  Gait narrow-based and steady, able to tandem walk adequately.  Romberg negative. Negative Tinel sign at the elbow and wrists.    IMPRESSION: This is a pleasant 50 yo RH woman with a history of bipolar disorder with rare generalized convulsions. Her 24-hour EEG had shown rare left frontal epileptiform discharges. MRI brain had shown hippocampal asymmetry, concerning for possible migrational abnormality in the left temporal lobe. 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 She had been seizure-free for 4 years until she had a convulsion in 04/2018 due to suddenly stopping Xanax. She has been doing well since then on Lamotrigine ER 200mg  daily. Headaches have not been bothersome. She is reporting bilateral arm weakness, no clear motor or sensory deficits on exam today, neurological exam normal. May consider physical  therapy, if no improvement we can do an EMG/NCV of both upper extremities. She is aware of [place] driving laws to stop driving after a seizure until 6 months seizure-free. Follow-up in 1 year, call for any changes.   Thank you for allowing me to participate in her care.  Please do not hesitate to call for any questions or concerns.   , M.D.   CC: Dr. Patrcia Dolly

## 2020-08-29 NOTE — Patient Instructions (Signed)
Always good to see you!  1. Continue Lamotrigine ER 200mg  daily  2. Discuss arm symptoms with your doctor, may consider physical therapy. If no improvement, we can schedule a nerve and muscle test (EMG).  3. Follow-up in 1 year, call for any changes   Seizure Precautions: 1. If medication has been prescribed for you to prevent seizures, take it exactly as directed.  Do not stop taking the medicine without talking to your doctor first, even if you have not had a seizure in a long time.   2. Avoid activities in which a seizure would cause danger to yourself or to others.  Don't operate dangerous machinery, swim alone, or climb in high or dangerous places, such as on ladders, roofs, or girders.  Do not drive unless your doctor says you may.  3. If you have any warning that you may have a seizure, lay down in a safe place where you can't hurt yourself.    4.  No driving for 6 months from last seizure, as per [facility].   Please refer to the following link on the [employer]ile[employer] of [employer]'s website for more information: http://www.epilepsyfoundation.org/answerplace/Social/driving/drivingu.cfm   5.  Maintain good sleep hygiene. Avoid alcohol.  6.  Contact your doctor if you have any problems that may be related to the medicine you are taking.  7.  Call 911 and bring the patient back to the ED if:        A.  The seizure lasts longer than 5 minutes.       B.  The patient doesn't awaken shortly after the seizure  C.  The patient has new problems such as difficulty seeing, speaking or moving  D.  The patient was injured during the seizure  E.  The patient has a temperature over 102 F (39C)  F.  The patient vomited and now is having trouble breathing

## 2020-09-01 DIAGNOSIS — K5909 Other constipation: Secondary | ICD-10-CM | POA: Diagnosis not present

## 2020-09-10 IMAGING — US US BREAST*R* LIMITED INC AXILLA
1 series · 3 of 3 positions shown · non-contrast
Comparison: April 12, 2019

CLINICAL DATA: 48-year-old patient recalled from recent screening
mammogram for evaluation of a possible asymmetry in the
retroareolar/slightly inferior right breast. The asymmetry was
identified only in the MLO projection.

EXAM:
DIGITAL DIAGNOSTIC RIGHT MAMMOGRAM WITH CAD AND TOMO
ULTRASOUND RIGHT BREAST

[Series 1: us breast*right* limited inc axilla · 0.06mm/px · 3 of 3 slices shown]
[im 1/3]
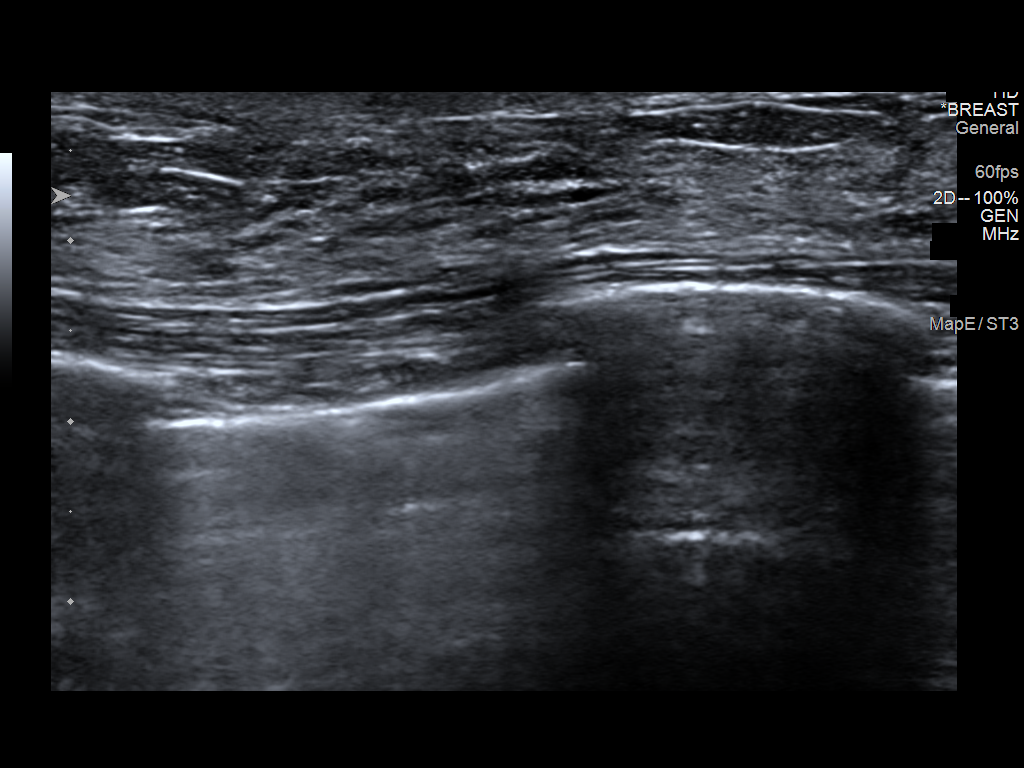
[im 2/3]
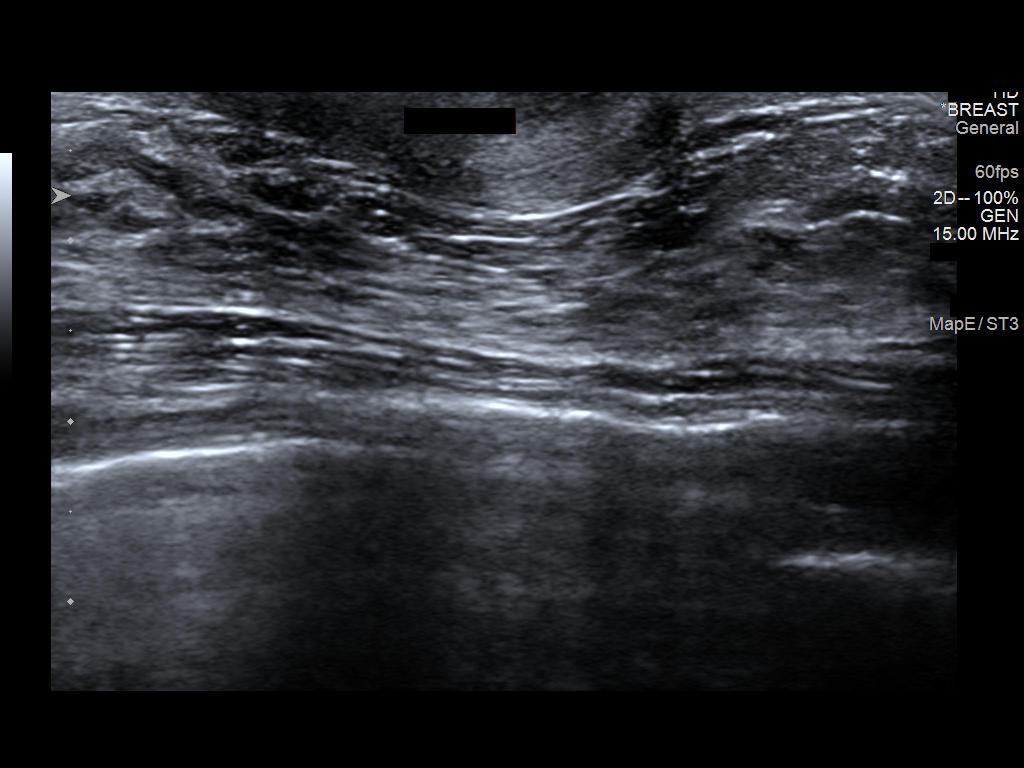
[im 3/3]
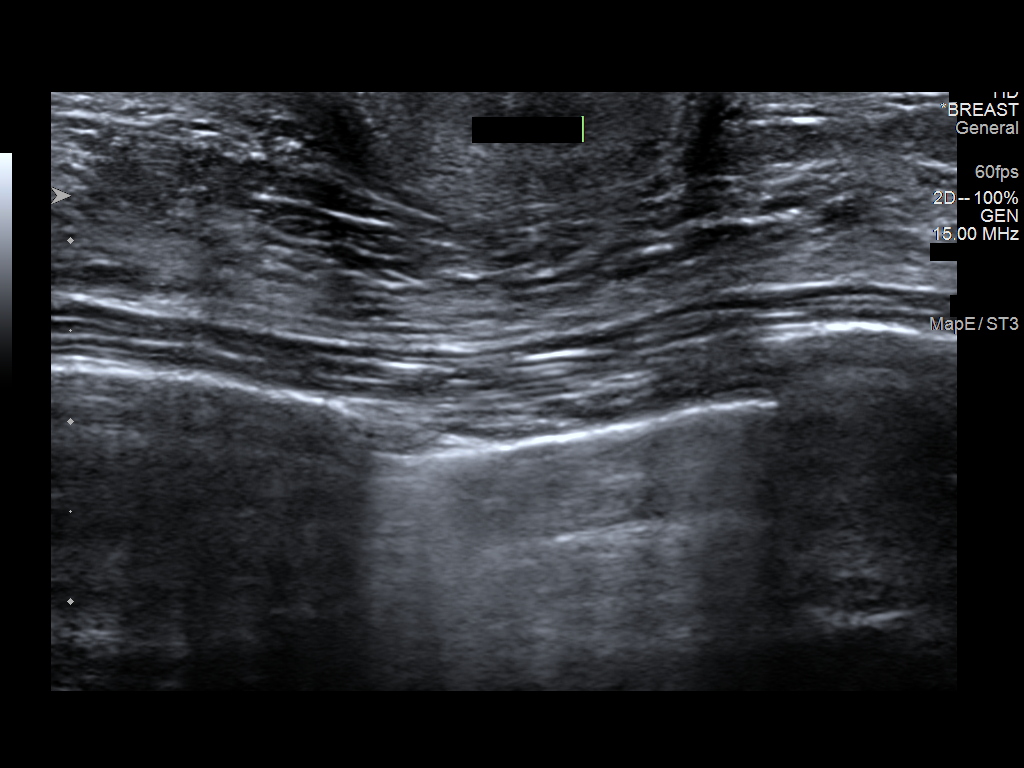

[3 of 3 positions shown; findings below may reference images not displayed]

ACR Breast Density Category d: The breast tissue is extremely dense,
which lowers the sensitivity of mammography.
FINDINGS: Spot compression view of the central right breast with tomography
shows no persistent asymmetry. No mass or distortion is identified.
90 degree lateral view of the right breast is negative. Given the
extremely dense breast tissue, ultrasound is also performed, see
below.

Mammographic images were processed with CAD.

Targeted ultrasound is performed, showing normal appearing dense
fibroglandular tissue in the central/slightly inferior right breast.
No solid or cystic mass or abnormal shadowing is identified to
suggest malignancy.
IMPRESSION: No evidence of malignancy in the right breast.

RECOMMENDATION:
Screening mammogram in one year.(Code:HL-[id number])

I have discussed the findings and recommendations with the patient.
If applicable, a reminder letter will be sent to the patient
regarding the next appointment.

BI-RADS CATEGORY  1: Negative.

## 2020-10-09 DIAGNOSIS — M25522 Pain in left elbow: Secondary | ICD-10-CM | POA: Diagnosis not present

## 2020-10-09 DIAGNOSIS — M25521 Pain in right elbow: Secondary | ICD-10-CM | POA: Diagnosis not present

## 2020-10-17 DIAGNOSIS — F900 Attention-deficit hyperactivity disorder, predominantly inattentive type: Secondary | ICD-10-CM | POA: Diagnosis not present

## 2020-10-17 DIAGNOSIS — F3176 Bipolar disorder, in full remission, most recent episode depressed: Secondary | ICD-10-CM | POA: Diagnosis not present

## 2020-10-17 DIAGNOSIS — F4322 Adjustment disorder with anxiety: Secondary | ICD-10-CM | POA: Diagnosis not present

## 2020-10-17 DIAGNOSIS — F3174 Bipolar disorder, in full remission, most recent episode manic: Secondary | ICD-10-CM | POA: Diagnosis not present

## 2020-10-29 DIAGNOSIS — Z1211 Encounter for screening for malignant neoplasm of colon: Secondary | ICD-10-CM | POA: Diagnosis not present

## 2020-10-29 DIAGNOSIS — R142 Eructation: Secondary | ICD-10-CM | POA: Diagnosis not present

## 2020-11-13 DIAGNOSIS — Z124 Encounter for screening for malignant neoplasm of cervix: Secondary | ICD-10-CM | POA: Diagnosis not present

## 2020-11-13 DIAGNOSIS — Z09 Encounter for follow-up examination after completed treatment for conditions other than malignant neoplasm: Secondary | ICD-10-CM | POA: Diagnosis not present

## 2020-12-02 DIAGNOSIS — M50322 Other cervical disc degeneration at C5-C6 level: Secondary | ICD-10-CM | POA: Diagnosis not present

## 2020-12-02 DIAGNOSIS — M9902 Segmental and somatic dysfunction of thoracic region: Secondary | ICD-10-CM | POA: Diagnosis not present

## 2020-12-02 DIAGNOSIS — M5384 Other specified dorsopathies, thoracic region: Secondary | ICD-10-CM | POA: Diagnosis not present

## 2020-12-02 DIAGNOSIS — M9901 Segmental and somatic dysfunction of cervical region: Secondary | ICD-10-CM | POA: Diagnosis not present

## 2020-12-04 DIAGNOSIS — M50322 Other cervical disc degeneration at C5-C6 level: Secondary | ICD-10-CM | POA: Diagnosis not present

## 2020-12-04 DIAGNOSIS — M5384 Other specified dorsopathies, thoracic region: Secondary | ICD-10-CM | POA: Diagnosis not present

## 2020-12-04 DIAGNOSIS — M9901 Segmental and somatic dysfunction of cervical region: Secondary | ICD-10-CM | POA: Diagnosis not present

## 2020-12-04 DIAGNOSIS — M9902 Segmental and somatic dysfunction of thoracic region: Secondary | ICD-10-CM | POA: Diagnosis not present

## 2020-12-09 DIAGNOSIS — M50322 Other cervical disc degeneration at C5-C6 level: Secondary | ICD-10-CM | POA: Diagnosis not present

## 2020-12-09 DIAGNOSIS — M9902 Segmental and somatic dysfunction of thoracic region: Secondary | ICD-10-CM | POA: Diagnosis not present

## 2020-12-09 DIAGNOSIS — M9901 Segmental and somatic dysfunction of cervical region: Secondary | ICD-10-CM | POA: Diagnosis not present

## 2020-12-09 DIAGNOSIS — M5384 Other specified dorsopathies, thoracic region: Secondary | ICD-10-CM | POA: Diagnosis not present

## 2020-12-10 DIAGNOSIS — M9902 Segmental and somatic dysfunction of thoracic region: Secondary | ICD-10-CM | POA: Diagnosis not present

## 2020-12-10 DIAGNOSIS — M5384 Other specified dorsopathies, thoracic region: Secondary | ICD-10-CM | POA: Diagnosis not present

## 2020-12-10 DIAGNOSIS — M9901 Segmental and somatic dysfunction of cervical region: Secondary | ICD-10-CM | POA: Diagnosis not present

## 2020-12-10 DIAGNOSIS — M50322 Other cervical disc degeneration at C5-C6 level: Secondary | ICD-10-CM | POA: Diagnosis not present

## 2020-12-16 DIAGNOSIS — M50322 Other cervical disc degeneration at C5-C6 level: Secondary | ICD-10-CM | POA: Diagnosis not present

## 2020-12-16 DIAGNOSIS — M9901 Segmental and somatic dysfunction of cervical region: Secondary | ICD-10-CM | POA: Diagnosis not present

## 2020-12-16 DIAGNOSIS — M5384 Other specified dorsopathies, thoracic region: Secondary | ICD-10-CM | POA: Diagnosis not present

## 2020-12-16 DIAGNOSIS — M9902 Segmental and somatic dysfunction of thoracic region: Secondary | ICD-10-CM | POA: Diagnosis not present

## 2020-12-18 DIAGNOSIS — M9902 Segmental and somatic dysfunction of thoracic region: Secondary | ICD-10-CM | POA: Diagnosis not present

## 2020-12-18 DIAGNOSIS — M9901 Segmental and somatic dysfunction of cervical region: Secondary | ICD-10-CM | POA: Diagnosis not present

## 2020-12-18 DIAGNOSIS — M50322 Other cervical disc degeneration at C5-C6 level: Secondary | ICD-10-CM | POA: Diagnosis not present

## 2020-12-18 DIAGNOSIS — M5384 Other specified dorsopathies, thoracic region: Secondary | ICD-10-CM | POA: Diagnosis not present

## 2020-12-22 DIAGNOSIS — M9901 Segmental and somatic dysfunction of cervical region: Secondary | ICD-10-CM | POA: Diagnosis not present

## 2020-12-22 DIAGNOSIS — M50322 Other cervical disc degeneration at C5-C6 level: Secondary | ICD-10-CM | POA: Diagnosis not present

## 2020-12-22 DIAGNOSIS — M5384 Other specified dorsopathies, thoracic region: Secondary | ICD-10-CM | POA: Diagnosis not present

## 2020-12-22 DIAGNOSIS — M9902 Segmental and somatic dysfunction of thoracic region: Secondary | ICD-10-CM | POA: Diagnosis not present

## 2020-12-23 DIAGNOSIS — M5384 Other specified dorsopathies, thoracic region: Secondary | ICD-10-CM | POA: Diagnosis not present

## 2020-12-23 DIAGNOSIS — M9901 Segmental and somatic dysfunction of cervical region: Secondary | ICD-10-CM | POA: Diagnosis not present

## 2020-12-23 DIAGNOSIS — M50322 Other cervical disc degeneration at C5-C6 level: Secondary | ICD-10-CM | POA: Diagnosis not present

## 2020-12-23 DIAGNOSIS — M9902 Segmental and somatic dysfunction of thoracic region: Secondary | ICD-10-CM | POA: Diagnosis not present

## 2020-12-25 DIAGNOSIS — M9901 Segmental and somatic dysfunction of cervical region: Secondary | ICD-10-CM | POA: Diagnosis not present

## 2020-12-25 DIAGNOSIS — M5384 Other specified dorsopathies, thoracic region: Secondary | ICD-10-CM | POA: Diagnosis not present

## 2020-12-25 DIAGNOSIS — M50322 Other cervical disc degeneration at C5-C6 level: Secondary | ICD-10-CM | POA: Diagnosis not present

## 2020-12-25 DIAGNOSIS — M9902 Segmental and somatic dysfunction of thoracic region: Secondary | ICD-10-CM | POA: Diagnosis not present

## 2020-12-29 DIAGNOSIS — M50322 Other cervical disc degeneration at C5-C6 level: Secondary | ICD-10-CM | POA: Diagnosis not present

## 2020-12-29 DIAGNOSIS — M9902 Segmental and somatic dysfunction of thoracic region: Secondary | ICD-10-CM | POA: Diagnosis not present

## 2020-12-29 DIAGNOSIS — M5384 Other specified dorsopathies, thoracic region: Secondary | ICD-10-CM | POA: Diagnosis not present

## 2020-12-29 DIAGNOSIS — M9901 Segmental and somatic dysfunction of cervical region: Secondary | ICD-10-CM | POA: Diagnosis not present

## 2020-12-31 DIAGNOSIS — M50322 Other cervical disc degeneration at C5-C6 level: Secondary | ICD-10-CM | POA: Diagnosis not present

## 2020-12-31 DIAGNOSIS — M9902 Segmental and somatic dysfunction of thoracic region: Secondary | ICD-10-CM | POA: Diagnosis not present

## 2020-12-31 DIAGNOSIS — M9901 Segmental and somatic dysfunction of cervical region: Secondary | ICD-10-CM | POA: Diagnosis not present

## 2020-12-31 DIAGNOSIS — M5384 Other specified dorsopathies, thoracic region: Secondary | ICD-10-CM | POA: Diagnosis not present

## 2021-01-02 DIAGNOSIS — R7301 Impaired fasting glucose: Secondary | ICD-10-CM | POA: Diagnosis not present

## 2021-01-02 DIAGNOSIS — E611 Iron deficiency: Secondary | ICD-10-CM | POA: Diagnosis not present

## 2021-01-02 DIAGNOSIS — Z79899 Other long term (current) drug therapy: Secondary | ICD-10-CM | POA: Diagnosis not present

## 2021-01-06 DIAGNOSIS — M9902 Segmental and somatic dysfunction of thoracic region: Secondary | ICD-10-CM | POA: Diagnosis not present

## 2021-01-06 DIAGNOSIS — M9901 Segmental and somatic dysfunction of cervical region: Secondary | ICD-10-CM | POA: Diagnosis not present

## 2021-01-06 DIAGNOSIS — M50322 Other cervical disc degeneration at C5-C6 level: Secondary | ICD-10-CM | POA: Diagnosis not present

## 2021-01-06 DIAGNOSIS — M5384 Other specified dorsopathies, thoracic region: Secondary | ICD-10-CM | POA: Diagnosis not present

## 2021-01-08 DIAGNOSIS — M9901 Segmental and somatic dysfunction of cervical region: Secondary | ICD-10-CM | POA: Diagnosis not present

## 2021-01-08 DIAGNOSIS — M9902 Segmental and somatic dysfunction of thoracic region: Secondary | ICD-10-CM | POA: Diagnosis not present

## 2021-01-08 DIAGNOSIS — M5384 Other specified dorsopathies, thoracic region: Secondary | ICD-10-CM | POA: Diagnosis not present

## 2021-01-08 DIAGNOSIS — M50322 Other cervical disc degeneration at C5-C6 level: Secondary | ICD-10-CM | POA: Diagnosis not present

## 2021-01-09 DIAGNOSIS — Z23 Encounter for immunization: Secondary | ICD-10-CM | POA: Diagnosis not present

## 2021-01-09 DIAGNOSIS — Z0000 Encounter for general adult medical examination without abnormal findings: Secondary | ICD-10-CM | POA: Diagnosis not present

## 2021-01-09 DIAGNOSIS — Z1339 Encounter for screening examination for other mental health and behavioral disorders: Secondary | ICD-10-CM | POA: Diagnosis not present

## 2021-01-09 DIAGNOSIS — R82998 Other abnormal findings in urine: Secondary | ICD-10-CM | POA: Diagnosis not present

## 2021-01-09 DIAGNOSIS — Z1331 Encounter for screening for depression: Secondary | ICD-10-CM | POA: Diagnosis not present

## 2021-01-09 DIAGNOSIS — R7301 Impaired fasting glucose: Secondary | ICD-10-CM | POA: Diagnosis not present

## 2021-01-13 DIAGNOSIS — M9901 Segmental and somatic dysfunction of cervical region: Secondary | ICD-10-CM | POA: Diagnosis not present

## 2021-01-13 DIAGNOSIS — M5384 Other specified dorsopathies, thoracic region: Secondary | ICD-10-CM | POA: Diagnosis not present

## 2021-01-13 DIAGNOSIS — M50322 Other cervical disc degeneration at C5-C6 level: Secondary | ICD-10-CM | POA: Diagnosis not present

## 2021-01-13 DIAGNOSIS — M9902 Segmental and somatic dysfunction of thoracic region: Secondary | ICD-10-CM | POA: Diagnosis not present

## 2021-01-20 DIAGNOSIS — M9901 Segmental and somatic dysfunction of cervical region: Secondary | ICD-10-CM | POA: Diagnosis not present

## 2021-01-20 DIAGNOSIS — M5384 Other specified dorsopathies, thoracic region: Secondary | ICD-10-CM | POA: Diagnosis not present

## 2021-01-20 DIAGNOSIS — M9902 Segmental and somatic dysfunction of thoracic region: Secondary | ICD-10-CM | POA: Diagnosis not present

## 2021-01-20 DIAGNOSIS — M50322 Other cervical disc degeneration at C5-C6 level: Secondary | ICD-10-CM | POA: Diagnosis not present

## 2021-02-05 DIAGNOSIS — M50322 Other cervical disc degeneration at C5-C6 level: Secondary | ICD-10-CM | POA: Diagnosis not present

## 2021-02-05 DIAGNOSIS — M9901 Segmental and somatic dysfunction of cervical region: Secondary | ICD-10-CM | POA: Diagnosis not present

## 2021-02-05 DIAGNOSIS — M9902 Segmental and somatic dysfunction of thoracic region: Secondary | ICD-10-CM | POA: Diagnosis not present

## 2021-02-05 DIAGNOSIS — M5384 Other specified dorsopathies, thoracic region: Secondary | ICD-10-CM | POA: Diagnosis not present

## 2021-02-11 DIAGNOSIS — M9902 Segmental and somatic dysfunction of thoracic region: Secondary | ICD-10-CM | POA: Diagnosis not present

## 2021-02-11 DIAGNOSIS — M50322 Other cervical disc degeneration at C5-C6 level: Secondary | ICD-10-CM | POA: Diagnosis not present

## 2021-02-11 DIAGNOSIS — M9901 Segmental and somatic dysfunction of cervical region: Secondary | ICD-10-CM | POA: Diagnosis not present

## 2021-02-11 DIAGNOSIS — M5384 Other specified dorsopathies, thoracic region: Secondary | ICD-10-CM | POA: Diagnosis not present

## 2021-02-13 DIAGNOSIS — K649 Unspecified hemorrhoids: Secondary | ICD-10-CM | POA: Diagnosis not present

## 2021-02-13 DIAGNOSIS — Z1211 Encounter for screening for malignant neoplasm of colon: Secondary | ICD-10-CM | POA: Diagnosis not present

## 2021-02-13 DIAGNOSIS — Q438 Other specified congenital malformations of intestine: Secondary | ICD-10-CM | POA: Diagnosis not present

## 2021-03-04 ENCOUNTER — Other Ambulatory Visit (HOSPITAL_BASED_OUTPATIENT_CLINIC_OR_DEPARTMENT_OTHER): Payer: Self-pay

## 2021-03-04 MED ORDER — AMPHETAMINE-DEXTROAMPHET ER 30 MG PO CP24
ORAL_CAPSULE | ORAL | 0 refills | Status: DC
  Filled 2021-03-04: qty 60, 30d supply, fill #0

## 2021-04-07 ENCOUNTER — Other Ambulatory Visit (HOSPITAL_BASED_OUTPATIENT_CLINIC_OR_DEPARTMENT_OTHER): Payer: Self-pay

## 2021-04-07 MED ORDER — AMPHETAMINE-DEXTROAMPHET ER 30 MG PO CP24
ORAL_CAPSULE | ORAL | 0 refills | Status: AC
  Filled 2021-04-07: qty 60, 30d supply, fill #0

## 2021-04-13 DIAGNOSIS — F900 Attention-deficit hyperactivity disorder, predominantly inattentive type: Secondary | ICD-10-CM | POA: Diagnosis not present

## 2021-04-13 DIAGNOSIS — F3176 Bipolar disorder, in full remission, most recent episode depressed: Secondary | ICD-10-CM | POA: Diagnosis not present

## 2021-04-13 DIAGNOSIS — F410 Panic disorder [episodic paroxysmal anxiety] without agoraphobia: Secondary | ICD-10-CM | POA: Diagnosis not present

## 2021-04-13 DIAGNOSIS — F3174 Bipolar disorder, in full remission, most recent episode manic: Secondary | ICD-10-CM | POA: Diagnosis not present

## 2021-05-11 ENCOUNTER — Other Ambulatory Visit (HOSPITAL_BASED_OUTPATIENT_CLINIC_OR_DEPARTMENT_OTHER): Payer: Self-pay

## 2021-05-11 MED ORDER — AMPHETAMINE-DEXTROAMPHET ER 30 MG PO CP24
60.0000 mg | ORAL_CAPSULE | Freq: Every morning | ORAL | 0 refills | Status: DC
  Filled 2021-05-11 – 2021-06-25 (×3): qty 60, 30d supply, fill #0

## 2021-05-12 ENCOUNTER — Other Ambulatory Visit (HOSPITAL_BASED_OUTPATIENT_CLINIC_OR_DEPARTMENT_OTHER): Payer: Self-pay

## 2021-05-15 ENCOUNTER — Other Ambulatory Visit (HOSPITAL_BASED_OUTPATIENT_CLINIC_OR_DEPARTMENT_OTHER): Payer: Self-pay

## 2021-05-18 ENCOUNTER — Other Ambulatory Visit (HOSPITAL_BASED_OUTPATIENT_CLINIC_OR_DEPARTMENT_OTHER): Payer: Self-pay

## 2021-05-19 DIAGNOSIS — Z01419 Encounter for gynecological examination (general) (routine) without abnormal findings: Secondary | ICD-10-CM | POA: Diagnosis not present

## 2021-05-19 DIAGNOSIS — Z1231 Encounter for screening mammogram for malignant neoplasm of breast: Secondary | ICD-10-CM | POA: Diagnosis not present

## 2021-05-19 DIAGNOSIS — Z1389 Encounter for screening for other disorder: Secondary | ICD-10-CM | POA: Diagnosis not present

## 2021-05-19 DIAGNOSIS — Z130 Encounter for screening for diseases of the blood and blood-forming organs and certain disorders involving the immune mechanism: Secondary | ICD-10-CM | POA: Diagnosis not present

## 2021-05-28 ENCOUNTER — Other Ambulatory Visit: Payer: Self-pay | Admitting: Gynecology

## 2021-05-28 DIAGNOSIS — R928 Other abnormal and inconclusive findings on diagnostic imaging of breast: Secondary | ICD-10-CM

## 2021-06-02 ENCOUNTER — Other Ambulatory Visit (HOSPITAL_BASED_OUTPATIENT_CLINIC_OR_DEPARTMENT_OTHER): Payer: Self-pay

## 2021-06-02 MED ORDER — ALPRAZOLAM ER 3 MG PO TB24
ORAL_TABLET | Freq: Every day | ORAL | 0 refills | Status: AC
  Filled 2021-06-02: qty 30, 30d supply, fill #0
  Filled 2021-07-06: qty 30, 30d supply, fill #1
  Filled 2021-08-03 – 2021-08-07 (×2): qty 30, 30d supply, fill #2

## 2021-06-04 ENCOUNTER — Other Ambulatory Visit (HOSPITAL_BASED_OUTPATIENT_CLINIC_OR_DEPARTMENT_OTHER): Payer: Self-pay

## 2021-06-23 ENCOUNTER — Ambulatory Visit
Admission: RE | Admit: 2021-06-23 | Discharge: 2021-06-23 | Disposition: A | Discharge status: Home / Self Care | Payer: BC Managed Care – PPO | Source: Ambulatory Visit | Attending: Gynecology | Admitting: Gynecology

## 2021-06-23 ENCOUNTER — Other Ambulatory Visit: Payer: Self-pay

## 2021-06-23 DIAGNOSIS — R928 Other abnormal and inconclusive findings on diagnostic imaging of breast: Secondary | ICD-10-CM

## 2021-06-23 DIAGNOSIS — R922 Inconclusive mammogram: Secondary | ICD-10-CM | POA: Diagnosis not present

## 2021-06-23 DIAGNOSIS — N6489 Other specified disorders of breast: Secondary | ICD-10-CM | POA: Diagnosis not present

## 2021-06-25 ENCOUNTER — Other Ambulatory Visit (HOSPITAL_BASED_OUTPATIENT_CLINIC_OR_DEPARTMENT_OTHER): Payer: Self-pay

## 2021-07-06 ENCOUNTER — Other Ambulatory Visit (HOSPITAL_BASED_OUTPATIENT_CLINIC_OR_DEPARTMENT_OTHER): Payer: Self-pay

## 2021-08-03 ENCOUNTER — Other Ambulatory Visit (HOSPITAL_BASED_OUTPATIENT_CLINIC_OR_DEPARTMENT_OTHER): Payer: Self-pay

## 2021-08-03 MED ORDER — AMPHETAMINE-DEXTROAMPHET ER 30 MG PO CP24
60.0000 mg | ORAL_CAPSULE | Freq: Every morning | ORAL | 0 refills | Status: DC
  Filled 2021-08-03: qty 60, 30d supply, fill #0

## 2021-08-04 ENCOUNTER — Other Ambulatory Visit (HOSPITAL_BASED_OUTPATIENT_CLINIC_OR_DEPARTMENT_OTHER): Payer: Self-pay

## 2021-08-04 MED ORDER — QUETIAPINE FUMARATE 300 MG PO TABS
ORAL_TABLET | ORAL | 1 refills | Status: DC
  Filled 2021-08-04: qty 30, 30d supply, fill #0
  Filled 2021-09-15: qty 30, 30d supply, fill #1
  Filled 2021-10-19: qty 30, 30d supply, fill #2
  Filled 2022-02-01: qty 30, 30d supply, fill #3
  Filled 2022-02-27: qty 30, 30d supply, fill #4

## 2021-08-07 ENCOUNTER — Other Ambulatory Visit (HOSPITAL_BASED_OUTPATIENT_CLINIC_OR_DEPARTMENT_OTHER): Payer: Self-pay

## 2021-08-21 ENCOUNTER — Other Ambulatory Visit (HOSPITAL_BASED_OUTPATIENT_CLINIC_OR_DEPARTMENT_OTHER): Payer: Self-pay

## 2021-08-21 DIAGNOSIS — R3915 Urgency of urination: Secondary | ICD-10-CM | POA: Diagnosis not present

## 2021-08-21 DIAGNOSIS — R351 Nocturia: Secondary | ICD-10-CM | POA: Diagnosis not present

## 2021-08-21 DIAGNOSIS — R350 Frequency of micturition: Secondary | ICD-10-CM | POA: Diagnosis not present

## 2021-08-21 MED ORDER — OXYBUTYNIN CHLORIDE ER 10 MG PO TB24
ORAL_TABLET | ORAL | 11 refills | Status: DC
  Filled 2021-08-21: qty 30, 30d supply, fill #0

## 2021-08-25 ENCOUNTER — Other Ambulatory Visit (HOSPITAL_BASED_OUTPATIENT_CLINIC_OR_DEPARTMENT_OTHER): Payer: Self-pay

## 2021-08-25 MED ORDER — AMPHETAMINE-DEXTROAMPHET ER 30 MG PO CP24
ORAL_CAPSULE | ORAL | 0 refills | Status: DC
  Filled 2021-08-25: qty 10, 5d supply, fill #0
  Filled ????-??-??: fill #0

## 2021-08-28 ENCOUNTER — Other Ambulatory Visit (HOSPITAL_BASED_OUTPATIENT_CLINIC_OR_DEPARTMENT_OTHER): Payer: Self-pay

## 2021-09-03 ENCOUNTER — Other Ambulatory Visit: Payer: Self-pay | Admitting: Neurology

## 2021-09-03 DIAGNOSIS — G40209 Localization-related (focal) (partial) symptomatic epilepsy and epileptic syndromes with complex partial seizures, not intractable, without status epilepticus: Secondary | ICD-10-CM

## 2021-09-14 ENCOUNTER — Other Ambulatory Visit (HOSPITAL_BASED_OUTPATIENT_CLINIC_OR_DEPARTMENT_OTHER): Payer: Self-pay

## 2021-09-14 MED ORDER — AMPHETAMINE-DEXTROAMPHET ER 30 MG PO CP24
60.0000 mg | ORAL_CAPSULE | Freq: Every morning | ORAL | 0 refills | Status: DC
  Filled 2021-09-14: qty 60, 30d supply, fill #0

## 2021-09-15 ENCOUNTER — Other Ambulatory Visit (HOSPITAL_BASED_OUTPATIENT_CLINIC_OR_DEPARTMENT_OTHER): Payer: Self-pay

## 2021-09-16 ENCOUNTER — Other Ambulatory Visit (HOSPITAL_BASED_OUTPATIENT_CLINIC_OR_DEPARTMENT_OTHER): Payer: Self-pay

## 2021-09-21 ENCOUNTER — Ambulatory Visit (INDEPENDENT_AMBULATORY_CARE_PROVIDER_SITE_OTHER): Payer: BC Managed Care – PPO | Admitting: Neurology

## 2021-09-21 ENCOUNTER — Encounter: Payer: Self-pay | Admitting: Neurology

## 2021-09-21 DIAGNOSIS — G40209 Localization-related (focal) (partial) symptomatic epilepsy and epileptic syndromes with complex partial seizures, not intractable, without status epilepticus: Secondary | ICD-10-CM | POA: Diagnosis not present

## 2021-09-21 MED ORDER — LAMOTRIGINE ER 200 MG PO TB24: 1.0000 | ORAL_TABLET | Freq: Every day | ORAL | 3 refills | Status: DC

## 2021-10-13 ENCOUNTER — Other Ambulatory Visit (HOSPITAL_BASED_OUTPATIENT_CLINIC_OR_DEPARTMENT_OTHER): Payer: Self-pay

## 2021-10-13 DIAGNOSIS — F900 Attention-deficit hyperactivity disorder, predominantly inattentive type: Secondary | ICD-10-CM | POA: Diagnosis not present

## 2021-10-13 DIAGNOSIS — F3176 Bipolar disorder, in full remission, most recent episode depressed: Secondary | ICD-10-CM | POA: Diagnosis not present

## 2021-10-13 DIAGNOSIS — F5101 Primary insomnia: Secondary | ICD-10-CM | POA: Diagnosis not present

## 2021-10-13 DIAGNOSIS — F3174 Bipolar disorder, in full remission, most recent episode manic: Secondary | ICD-10-CM | POA: Diagnosis not present

## 2021-10-13 MED ORDER — BELSOMRA 20 MG PO TABS
20.0000 mg | ORAL_TABLET | Freq: Every day | ORAL | 5 refills | Status: DC
  Filled 2021-10-13: qty 30, 30d supply, fill #0

## 2021-10-16 ENCOUNTER — Other Ambulatory Visit (HOSPITAL_BASED_OUTPATIENT_CLINIC_OR_DEPARTMENT_OTHER): Payer: Self-pay

## 2021-10-19 ENCOUNTER — Other Ambulatory Visit (HOSPITAL_BASED_OUTPATIENT_CLINIC_OR_DEPARTMENT_OTHER): Payer: Self-pay

## 2021-10-19 MED ORDER — AMPHETAMINE-DEXTROAMPHET ER 30 MG PO CP24: ORAL_CAPSULE | ORAL | 0 refills | Status: DC

## 2021-10-20 ENCOUNTER — Other Ambulatory Visit (HOSPITAL_BASED_OUTPATIENT_CLINIC_OR_DEPARTMENT_OTHER): Payer: Self-pay

## 2021-10-20 MED ORDER — AMPHETAMINE-DEXTROAMPHET ER 30 MG PO CP24
60.0000 mg | ORAL_CAPSULE | Freq: Every morning | ORAL | 0 refills | Status: DC
  Filled 2021-10-20: qty 60, 30d supply, fill #0

## 2021-10-22 ENCOUNTER — Other Ambulatory Visit (HOSPITAL_BASED_OUTPATIENT_CLINIC_OR_DEPARTMENT_OTHER): Payer: Self-pay

## 2021-11-03 ENCOUNTER — Other Ambulatory Visit (HOSPITAL_BASED_OUTPATIENT_CLINIC_OR_DEPARTMENT_OTHER): Payer: Self-pay

## 2021-11-03 MED ORDER — ALPRAZOLAM ER 1 MG PO TB24
ORAL_TABLET | ORAL | 2 refills | Status: DC
  Filled 2021-11-06: qty 90, 30d supply, fill #0

## 2021-11-06 ENCOUNTER — Other Ambulatory Visit (HOSPITAL_BASED_OUTPATIENT_CLINIC_OR_DEPARTMENT_OTHER): Payer: Self-pay

## 2021-11-09 ENCOUNTER — Other Ambulatory Visit (HOSPITAL_BASED_OUTPATIENT_CLINIC_OR_DEPARTMENT_OTHER): Payer: Self-pay

## 2021-11-19 DIAGNOSIS — F3174 Bipolar disorder, in full remission, most recent episode manic: Secondary | ICD-10-CM | POA: Diagnosis not present

## 2021-11-19 DIAGNOSIS — F4322 Adjustment disorder with anxiety: Secondary | ICD-10-CM | POA: Diagnosis not present

## 2021-11-19 DIAGNOSIS — F410 Panic disorder [episodic paroxysmal anxiety] without agoraphobia: Secondary | ICD-10-CM | POA: Diagnosis not present

## 2021-11-19 DIAGNOSIS — F3176 Bipolar disorder, in full remission, most recent episode depressed: Secondary | ICD-10-CM | POA: Diagnosis not present

## 2021-11-20 ENCOUNTER — Other Ambulatory Visit (HOSPITAL_BASED_OUTPATIENT_CLINIC_OR_DEPARTMENT_OTHER): Payer: Self-pay

## 2021-11-20 MED ORDER — QUETIAPINE FUMARATE ER 400 MG PO TB24
400.0000 mg | ORAL_TABLET | Freq: Every day | ORAL | 3 refills | Status: DC
  Filled 2021-11-20: qty 30, 30d supply, fill #0
  Filled 2021-12-24: qty 30, 30d supply, fill #1
  Filled 2022-01-25: qty 30, 30d supply, fill #2

## 2021-11-20 MED ORDER — QUETIAPINE FUMARATE 400 MG PO TABS
400.0000 mg | ORAL_TABLET | Freq: Every day | ORAL | 3 refills | Status: DC
  Filled 2021-11-20: qty 30, 30d supply, fill #0
  Filled 2021-12-24: qty 30, 30d supply, fill #1

## 2021-11-23 ENCOUNTER — Other Ambulatory Visit (HOSPITAL_BASED_OUTPATIENT_CLINIC_OR_DEPARTMENT_OTHER): Payer: Self-pay

## 2021-12-01 DIAGNOSIS — N921 Excessive and frequent menstruation with irregular cycle: Secondary | ICD-10-CM | POA: Diagnosis not present

## 2021-12-01 DIAGNOSIS — N951 Menopausal and female climacteric states: Secondary | ICD-10-CM | POA: Diagnosis not present

## 2021-12-03 DIAGNOSIS — N840 Polyp of corpus uteri: Secondary | ICD-10-CM | POA: Diagnosis not present

## 2021-12-03 DIAGNOSIS — N921 Excessive and frequent menstruation with irregular cycle: Secondary | ICD-10-CM | POA: Diagnosis not present

## 2021-12-09 ENCOUNTER — Other Ambulatory Visit (HOSPITAL_BASED_OUTPATIENT_CLINIC_OR_DEPARTMENT_OTHER): Payer: Self-pay

## 2021-12-24 ENCOUNTER — Other Ambulatory Visit (HOSPITAL_BASED_OUTPATIENT_CLINIC_OR_DEPARTMENT_OTHER): Payer: Self-pay

## 2022-01-26 ENCOUNTER — Other Ambulatory Visit (HOSPITAL_BASED_OUTPATIENT_CLINIC_OR_DEPARTMENT_OTHER): Payer: Self-pay

## 2022-01-26 DIAGNOSIS — R7301 Impaired fasting glucose: Secondary | ICD-10-CM | POA: Diagnosis not present

## 2022-01-26 DIAGNOSIS — Z79899 Other long term (current) drug therapy: Secondary | ICD-10-CM | POA: Diagnosis not present

## 2022-01-26 DIAGNOSIS — E611 Iron deficiency: Secondary | ICD-10-CM | POA: Diagnosis not present

## 2022-01-26 DIAGNOSIS — R7989 Other specified abnormal findings of blood chemistry: Secondary | ICD-10-CM | POA: Diagnosis not present

## 2022-02-01 ENCOUNTER — Other Ambulatory Visit (HOSPITAL_BASED_OUTPATIENT_CLINIC_OR_DEPARTMENT_OTHER): Payer: Self-pay

## 2022-02-01 DIAGNOSIS — R7301 Impaired fasting glucose: Secondary | ICD-10-CM | POA: Diagnosis not present

## 2022-02-01 DIAGNOSIS — Z23 Encounter for immunization: Secondary | ICD-10-CM | POA: Diagnosis not present

## 2022-02-01 DIAGNOSIS — R82998 Other abnormal findings in urine: Secondary | ICD-10-CM | POA: Diagnosis not present

## 2022-02-01 DIAGNOSIS — Z1331 Encounter for screening for depression: Secondary | ICD-10-CM | POA: Diagnosis not present

## 2022-02-01 DIAGNOSIS — Z0000 Encounter for general adult medical examination without abnormal findings: Secondary | ICD-10-CM | POA: Diagnosis not present

## 2022-02-01 DIAGNOSIS — Z1339 Encounter for screening examination for other mental health and behavioral disorders: Secondary | ICD-10-CM | POA: Diagnosis not present

## 2022-02-03 DIAGNOSIS — N939 Abnormal uterine and vaginal bleeding, unspecified: Secondary | ICD-10-CM | POA: Diagnosis not present

## 2022-02-03 DIAGNOSIS — N920 Excessive and frequent menstruation with regular cycle: Secondary | ICD-10-CM | POA: Diagnosis not present

## 2022-02-22 DIAGNOSIS — F4321 Adjustment disorder with depressed mood: Secondary | ICD-10-CM | POA: Diagnosis not present

## 2022-02-24 DIAGNOSIS — F3176 Bipolar disorder, in full remission, most recent episode depressed: Secondary | ICD-10-CM | POA: Diagnosis not present

## 2022-02-24 DIAGNOSIS — F900 Attention-deficit hyperactivity disorder, predominantly inattentive type: Secondary | ICD-10-CM | POA: Diagnosis not present

## 2022-02-24 DIAGNOSIS — F4321 Adjustment disorder with depressed mood: Secondary | ICD-10-CM | POA: Diagnosis not present

## 2022-02-24 DIAGNOSIS — F3174 Bipolar disorder, in full remission, most recent episode manic: Secondary | ICD-10-CM | POA: Diagnosis not present

## 2022-03-01 ENCOUNTER — Other Ambulatory Visit (HOSPITAL_COMMUNITY): Payer: Self-pay

## 2022-03-01 ENCOUNTER — Other Ambulatory Visit (HOSPITAL_BASED_OUTPATIENT_CLINIC_OR_DEPARTMENT_OTHER): Payer: Self-pay

## 2022-03-01 DIAGNOSIS — N920 Excessive and frequent menstruation with regular cycle: Secondary | ICD-10-CM | POA: Diagnosis not present

## 2022-03-01 DIAGNOSIS — N939 Abnormal uterine and vaginal bleeding, unspecified: Secondary | ICD-10-CM | POA: Diagnosis not present

## 2022-03-03 DIAGNOSIS — F4321 Adjustment disorder with depressed mood: Secondary | ICD-10-CM | POA: Diagnosis not present

## 2022-03-09 DIAGNOSIS — F4321 Adjustment disorder with depressed mood: Secondary | ICD-10-CM | POA: Diagnosis not present

## 2022-03-11 DIAGNOSIS — N951 Menopausal and female climacteric states: Secondary | ICD-10-CM | POA: Diagnosis not present

## 2022-03-15 DIAGNOSIS — F4321 Adjustment disorder with depressed mood: Secondary | ICD-10-CM | POA: Diagnosis not present

## 2022-04-06 DIAGNOSIS — F4321 Adjustment disorder with depressed mood: Secondary | ICD-10-CM | POA: Diagnosis not present

## 2022-04-09 DIAGNOSIS — F900 Attention-deficit hyperactivity disorder, predominantly inattentive type: Secondary | ICD-10-CM | POA: Diagnosis not present

## 2022-04-09 DIAGNOSIS — F5101 Primary insomnia: Secondary | ICD-10-CM | POA: Diagnosis not present

## 2022-04-09 DIAGNOSIS — F3176 Bipolar disorder, in full remission, most recent episode depressed: Secondary | ICD-10-CM | POA: Diagnosis not present

## 2022-04-09 DIAGNOSIS — F3174 Bipolar disorder, in full remission, most recent episode manic: Secondary | ICD-10-CM | POA: Diagnosis not present

## 2022-04-20 DIAGNOSIS — F4321 Adjustment disorder with depressed mood: Secondary | ICD-10-CM | POA: Diagnosis not present

## 2022-04-27 DIAGNOSIS — F4321 Adjustment disorder with depressed mood: Secondary | ICD-10-CM | POA: Diagnosis not present

## 2022-05-05 DIAGNOSIS — F4321 Adjustment disorder with depressed mood: Secondary | ICD-10-CM | POA: Diagnosis not present

## 2022-05-10 DIAGNOSIS — R29898 Other symptoms and signs involving the musculoskeletal system: Secondary | ICD-10-CM | POA: Diagnosis not present

## 2022-05-17 DIAGNOSIS — F4321 Adjustment disorder with depressed mood: Secondary | ICD-10-CM | POA: Diagnosis not present

## 2022-05-20 DIAGNOSIS — T753XXA Motion sickness, initial encounter: Secondary | ICD-10-CM | POA: Diagnosis not present

## 2022-05-20 DIAGNOSIS — Z1231 Encounter for screening mammogram for malignant neoplasm of breast: Secondary | ICD-10-CM | POA: Diagnosis not present

## 2022-05-20 DIAGNOSIS — Z130 Encounter for screening for diseases of the blood and blood-forming organs and certain disorders involving the immune mechanism: Secondary | ICD-10-CM | POA: Diagnosis not present

## 2022-05-20 DIAGNOSIS — N951 Menopausal and female climacteric states: Secondary | ICD-10-CM | POA: Diagnosis not present

## 2022-05-20 DIAGNOSIS — Z01419 Encounter for gynecological examination (general) (routine) without abnormal findings: Secondary | ICD-10-CM | POA: Diagnosis not present

## 2022-06-02 DIAGNOSIS — F4321 Adjustment disorder with depressed mood: Secondary | ICD-10-CM | POA: Diagnosis not present

## 2022-06-07 DIAGNOSIS — F4321 Adjustment disorder with depressed mood: Secondary | ICD-10-CM | POA: Diagnosis not present

## 2022-06-09 DIAGNOSIS — R92332 Mammographic heterogeneous density, left breast: Secondary | ICD-10-CM | POA: Diagnosis not present

## 2022-06-09 DIAGNOSIS — R922 Inconclusive mammogram: Secondary | ICD-10-CM | POA: Diagnosis not present

## 2022-06-14 DIAGNOSIS — F4321 Adjustment disorder with depressed mood: Secondary | ICD-10-CM | POA: Diagnosis not present

## 2022-06-29 DIAGNOSIS — F4321 Adjustment disorder with depressed mood: Secondary | ICD-10-CM | POA: Diagnosis not present

## 2022-07-05 DIAGNOSIS — F4321 Adjustment disorder with depressed mood: Secondary | ICD-10-CM | POA: Diagnosis not present

## 2022-07-08 DIAGNOSIS — N951 Menopausal and female climacteric states: Secondary | ICD-10-CM | POA: Diagnosis not present

## 2022-07-12 DIAGNOSIS — F4321 Adjustment disorder with depressed mood: Secondary | ICD-10-CM | POA: Diagnosis not present

## 2022-07-21 DIAGNOSIS — F4321 Adjustment disorder with depressed mood: Secondary | ICD-10-CM | POA: Diagnosis not present

## 2022-07-26 DIAGNOSIS — F4321 Adjustment disorder with depressed mood: Secondary | ICD-10-CM | POA: Diagnosis not present

## 2022-08-09 DIAGNOSIS — F4321 Adjustment disorder with depressed mood: Secondary | ICD-10-CM | POA: Diagnosis not present

## 2022-08-16 DIAGNOSIS — F4321 Adjustment disorder with depressed mood: Secondary | ICD-10-CM | POA: Diagnosis not present

## 2022-08-24 DIAGNOSIS — D2262 Melanocytic nevi of left upper limb, including shoulder: Secondary | ICD-10-CM | POA: Diagnosis not present

## 2022-08-24 DIAGNOSIS — D2261 Melanocytic nevi of right upper limb, including shoulder: Secondary | ICD-10-CM | POA: Diagnosis not present

## 2022-08-24 DIAGNOSIS — L811 Chloasma: Secondary | ICD-10-CM | POA: Diagnosis not present

## 2022-08-24 DIAGNOSIS — L814 Other melanin hyperpigmentation: Secondary | ICD-10-CM | POA: Diagnosis not present

## 2022-08-24 DIAGNOSIS — L570 Actinic keratosis: Secondary | ICD-10-CM | POA: Diagnosis not present

## 2022-08-25 DIAGNOSIS — F4321 Adjustment disorder with depressed mood: Secondary | ICD-10-CM | POA: Diagnosis not present

## 2022-08-26 DIAGNOSIS — N951 Menopausal and female climacteric states: Secondary | ICD-10-CM | POA: Diagnosis not present

## 2022-09-01 ENCOUNTER — Telehealth: Payer: Self-pay | Admitting: Neurology

## 2022-09-01 DIAGNOSIS — Z79899 Other long term (current) drug therapy: Secondary | ICD-10-CM

## 2022-09-01 DIAGNOSIS — G40209 Localization-related (focal) (partial) symptomatic epilepsy and epileptic syndromes with complex partial seizures, not intractable, without status epilepticus: Secondary | ICD-10-CM

## 2022-09-01 NOTE — Telephone Encounter (Signed)
Pt called an informed the Lamictal level may be reduced by the estrogen. Would check Lamictal level (before she takes her dose). On the other hand, just fyi that Lamictal may reduce effect of progesterone. Pt is on Day 6 of taken the new medications she will come by tomorrow to have lab work done

## 2022-09-01 NOTE — Telephone Encounter (Signed)
Pt has started taken hormones given by her GYN is wants to know if it will lower the lamotrigine threshold? She is taken progesterone 250 mg and (estrogen) Dotti 0.075 mg daily

## 2022-09-01 NOTE — Telephone Encounter (Signed)
Pt needs to speak with someone about a medication change  please call

## 2022-09-01 NOTE — Telephone Encounter (Signed)
Pls let her know that the Lamictal level may be reduced by the estrogen. Would check Lamictal level (before she takes her dose). On the other hand, just fyi that Lamictal may reduce effect of progesterone.

## 2022-09-02 ENCOUNTER — Other Ambulatory Visit (INDEPENDENT_AMBULATORY_CARE_PROVIDER_SITE_OTHER): Payer: BC Managed Care – PPO

## 2022-09-02 DIAGNOSIS — G40209 Localization-related (focal) (partial) symptomatic epilepsy and epileptic syndromes with complex partial seizures, not intractable, without status epilepticus: Secondary | ICD-10-CM | POA: Diagnosis not present

## 2022-09-02 DIAGNOSIS — Z79899 Other long term (current) drug therapy: Secondary | ICD-10-CM

## 2022-09-04 LAB — LAMOTRIGINE LEVEL: Lamotrigine Lvl: 4.4 ug/mL (ref 2.5–15.0)

## 2022-09-06 ENCOUNTER — Telehealth: Payer: Self-pay

## 2022-09-06 DIAGNOSIS — F4321 Adjustment disorder with depressed mood: Secondary | ICD-10-CM | POA: Diagnosis not present

## 2022-09-06 NOTE — Telephone Encounter (Signed)
Pt called informed Dr Karel Jarvis stated Lamictal level is stable from last time, continue current dose also we went back over from prior phone call the Lamictal level may be reduced by the estrogen. . On the other hand, just fyi that Lamictal may reduce effect of progesterone.

## 2022-09-06 NOTE — Telephone Encounter (Signed)
-----   Message from Van Clines, MD sent at 09/06/2022  9:11 AM EDT ----- Pls let her know Lamictal level is stable from last time, continue current dose, thanks

## 2022-09-15 ENCOUNTER — Other Ambulatory Visit (HOSPITAL_BASED_OUTPATIENT_CLINIC_OR_DEPARTMENT_OTHER): Payer: Self-pay

## 2022-09-15 DIAGNOSIS — F4321 Adjustment disorder with depressed mood: Secondary | ICD-10-CM | POA: Diagnosis not present

## 2022-09-15 MED ORDER — AMPHETAMINE-DEXTROAMPHET ER 30 MG PO CP24
60.0000 mg | ORAL_CAPSULE | Freq: Every morning | ORAL | 0 refills | Status: AC
Start: 2022-09-15 — End: ?
  Filled 2022-09-15: qty 60, 30d supply, fill #0

## 2022-09-16 ENCOUNTER — Ambulatory Visit (INDEPENDENT_AMBULATORY_CARE_PROVIDER_SITE_OTHER): Payer: BC Managed Care – PPO | Admitting: Neurology

## 2022-09-16 ENCOUNTER — Encounter: Payer: Self-pay | Admitting: Neurology

## 2022-09-16 VITALS — BP 101/72 | HR 82 | Ht 66.0 in | Wt 130.2 lb

## 2022-09-16 DIAGNOSIS — G40209 Localization-related (focal) (partial) symptomatic epilepsy and epileptic syndromes with complex partial seizures, not intractable, without status epilepticus: Secondary | ICD-10-CM

## 2022-09-16 DIAGNOSIS — Z79899 Other long term (current) drug therapy: Secondary | ICD-10-CM

## 2022-09-16 MED ORDER — LAMOTRIGINE ER 200 MG PO TB24: 1.0000 | ORAL_TABLET | Freq: Every day | ORAL | 3 refills | Status: DC

## 2022-09-16 NOTE — Progress Notes (Signed)
NEUROLOGY FOLLOW UP OFFICE NOTE  Sherry Tate [id number] 1970-10-04  HISTORY OF PRESENT ILLNESS: I had the pleasure of seeing [name] in follow-up in the neurology clinic on 09/16/2022.  The patient was last seen a year ago for seizures. She is alone in the office today.  Records and images were personally reviewed where available.  Since her last visit, she contacted our office earlier this month that she was started on estrogen and progesterone. Lamictal level may be reduced by estrogen, and Progesterone may be less effective. Lamictal level 08/2022 was 4.4 (4.2 in 04/2018). She is on Lamotrigine ER 200mg  daily without side effects. She states she had started the HRT 6 days prior to blood draw. She is doing good both physically and mentally. She denies any staring/unresponsive episodes, gaps in time, olfactory/gustatory hallucinations, focal numbness/tingling/weakness, myoclonic jerks. No headaches, dizziness, vision changes, no falls. She has a system for her sleep getting a total of 7 hrs and feeling rested.    History on Initial Assessment 04/23/2014: This is a pleasant 52 yo RH woman with a history of bipolar disorder, in her usual state of health until around November/December 2015 when she had a nocturnal seizure witnessed by her husband. She was stiff and shaking for 20-30 seconds, then went back to sleep. She woke up with her muscles sore, no focal weakness, no tongue bite or incontinence. It appears she did nto seek medical attention until after another seizure on 04/12/2014 in the morning while she was doing spelling words with her daughter. She recalls saying they had to get going, then she suddenly fell back shaking and moaning. Her daughter called her husband, and when she came to, he had come back home. Her daughter reports she was answering "no" to all the questions. She is amnestic of the event. She denies any prior warning symptoms.  She denies any olfactory/gustatory  hallucinations, deja vu, rising epigastric sensation, focal numbness/tingling/weakness, myoclonic jerks. She denies any alcohol or sleep deprivation prior to the seizures.   She had been taking Lamictal 200mg  qAM for 3-4 years but did not feel it was helping and tapered it off after discussing this with her psychiatrist. She had been completely off Lamictal for 2 months. She had asked to be restarted on Zoloft, which he had used for many years in the past. She had been taking Zoloft for 2 months when the seizure occurred. She takes Xanax XR 3mg  every morning since 2015, no change in dose, no missed doses. She has been taking Saphris for 3-4 years, which helps her sleep. For several years she has had mild 4/10 daily headaches that resolve after 30 minutes of taking Excedrin. There is no associated nausea, vomiting, photo/phonophobia, visual obscurations. She reports her memory is "horrible," she usually forgets what she went into a room for. She does not get lost driving, does not forget to take her medications.  Epilepsy Risk Factors:  She had a normal birth and early development.  There is no history of febrile convulsions, CNS infections such as meningitis/encephalitis, significant traumatic brain injury, neurosurgical procedures, or family history of seizures.  Diagnostic Data: I personally reviewed MRI brain with and without contrast which did not show any acute changes. There was asymmetry noted in the temporal lobes, with temporal horn more prominent in the right than left. 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  Routine EEG showed excess beta activity from benzodiazepine use.   24-hour EEG had shown rare left frontal sharp waves exclusively in sleep.   Her trough Lamictal level in May 2016 was 3.6.   PAST MEDICAL HISTORY: Past Medical History:  Diagnosis Date    Bipolar disorder (HCC)    Seizures (HCC)     MEDICATIONS: Current Outpatient Medications on File Prior to Visit  Medication Sig Dispense Refill   ADDERALL XR 30 MG 24 hr capsule Take 30 mg by mouth 2 (two) times daily.   0   ALPRAZolam (XANAX XR) 3 MG 24 hr tablet Take 1 tablet by mouth once daily 90 tablet 0   amphetamine-dextroamphetamine (ADDERALL XR) 30 MG 24 hr capsule Take 2 capsules by mouth every morning 60 capsule 0   amphetamine-dextroamphetamine (ADDERALL XR) 30 MG 24 hr capsule Take 2 capsules (60 mg total) by mouth every morning. 60 capsule 0   LamoTRIgine 200 MG TB24 24 hour tablet Take 1 tablet (200 mg total) by mouth daily. 90 tablet 3   linaclotide (LINZESS) 290 MCG CAPS capsule Take 290 mcg by mouth daily.     Melatonin 10 MG TABS Take by mouth.     No current facility-administered medications on file prior to visit.    ALLERGIES: Allergies  Allergen Reactions   Sulfa Antibiotics Other (See Comments)    jaundice    FAMILY HISTORY: Family History  Problem Relation Age of Onset   Dementia Mother     SOCIAL HISTORY: Social History   Socioeconomic History   Marital status: Married    Spouse name: Not on file   Number of children: 2   Years of education: Not on file   Highest education level: Not on file  Occupational History   Not on file  Tobacco Use   Smoking status: Former   Smokeless tobacco: Never  Vaping Use   Vaping Use: Every day  Substance and Sexual Activity   Alcohol use: No    Alcohol/week: 0.0 standard drinks of alcohol   Drug use: Yes    Types: Marijuana   Sexual activity: Yes    Partners: Male  Other Topics Concern   Not on file  Social History Narrative   Right handed      Highest level of eduPsychiatrist      Lives with husband two kids      Two story home   Social Determinants of Health   Financial Resource Strain: Not on file  Food Insecurity: Not on file  Transportation Needs: Not on file  Physical Activity: Not on  file  Stress: Not on file  Social Connections: Not on file  Intimate Partner Violence: Not on file     PHYSICAL EXAM: Vitals:   09/16/22 0927  BP: 101/72  Pulse: 82  SpO2: 99%   General: No acute distress Head:  Normocephalic/atraumatic Skin/Extremities: No rash, no edema Neurological Exam: alert and awake. No aphasia or dysarthria. Fund of knowledge is appropriate.  Attention and concentration are normal.   Cranial nerves: Pupils equal, round. Extraocular movements intact with no nystagmus. Visual fields full.  No facial asymmetry.  Motor: Bulk and tone normal, muscle strength 5/5 throughout with no pronator drift.   Finger to nose testing intact.  Gait narrow-based and steady, able to tandem walk adequately.  Romberg negative.   IMPRESSION: This is a pleasant 52 yo RH woman with a history of bipolar disorder with rare generalized convulsions. Her 24-hour EEG had shown rare  left frontal epileptiform discharges. MRI brain had shown hippocampal asymmetry, concerning for possible migrational abnormality in the left temporal lobe. 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 She continues to do well seizure-free since February 2020 on Lamotrigine ER 200mg  daily. She has recently been started on HRT, we discussed how estrogen can potentially lower levels of Lamotrigine, repeat Lamictal level will be done in July. She is aware of [place] driving laws to stop driving after a seizure until 6 months seizure-free. Follow-up in 1 year, call for any changes.   Thank you for allowing me to participate in her care.  Please do not hesitate to call for any questions or concerns.    Patrcia Dolly, M.D.   CC: Dr. Clelia Croft

## 2022-09-16 NOTE — Patient Instructions (Signed)
Always a pleasure to see you. Have bloodwork done for repeat Lamictal level first week of July. Continue Lamotrigine ER 200mg  daily. Follow-up in 1 year, call for any changes.    Seizure Precautions: 1. If medication has been prescribed for you to prevent seizures, take it exactly as directed.  Do not stop taking the medicine without talking to your doctor first, even if you have not had a seizure in a long time.   2. Avoid activities in which a seizure would cause danger to yourself or to others.  Don't operate dangerous machinery, swim alone, or climb in high or dangerous places, such as on ladders, roofs, or girders.  Do not drive unless your doctor says you may.  3. If you have any warning that you may have a seizure, lay down in a safe place where you can't hurt yourself.    4.  No driving for 6 months from last seizure, as per [facility].   Please refer to the following link on the Epilepsy Foundation of America's website for more information: http://www.epilepsyfoundation.org/answerplace/Social/driving/drivingu.cfm   5.  Maintain good sleep hygiene. Avoid alcohol.  6.  Contact your doctor if you have any problems that may be related to the medicine you are taking.  7.  Call 911 and bring the patient back to the ED if:        A.  The seizure lasts longer than 5 minutes.       B.  The patient doesn't awaken shortly after the seizure  C.  The patient has new problems such as difficulty seeing, speaking or moving  D.  The patient was injured during the seizure  E.  The patient has a temperature over 102 F (39C)  F.  The patient vomited and now is having trouble breathing

## 2022-09-22 DIAGNOSIS — F4321 Adjustment disorder with depressed mood: Secondary | ICD-10-CM | POA: Diagnosis not present

## 2022-09-27 DIAGNOSIS — F4321 Adjustment disorder with depressed mood: Secondary | ICD-10-CM | POA: Diagnosis not present

## 2022-10-04 ENCOUNTER — Other Ambulatory Visit (INDEPENDENT_AMBULATORY_CARE_PROVIDER_SITE_OTHER): Payer: BC Managed Care – PPO

## 2022-10-04 DIAGNOSIS — Z79899 Other long term (current) drug therapy: Secondary | ICD-10-CM

## 2022-10-04 DIAGNOSIS — G40209 Localization-related (focal) (partial) symptomatic epilepsy and epileptic syndromes with complex partial seizures, not intractable, without status epilepticus: Secondary | ICD-10-CM

## 2022-10-04 DIAGNOSIS — F3176 Bipolar disorder, in full remission, most recent episode depressed: Secondary | ICD-10-CM | POA: Diagnosis not present

## 2022-10-04 DIAGNOSIS — F3174 Bipolar disorder, in full remission, most recent episode manic: Secondary | ICD-10-CM | POA: Diagnosis not present

## 2022-10-04 DIAGNOSIS — F410 Panic disorder [episodic paroxysmal anxiety] without agoraphobia: Secondary | ICD-10-CM | POA: Diagnosis not present

## 2022-10-04 DIAGNOSIS — F5101 Primary insomnia: Secondary | ICD-10-CM | POA: Diagnosis not present

## 2022-10-06 DIAGNOSIS — F4321 Adjustment disorder with depressed mood: Secondary | ICD-10-CM | POA: Diagnosis not present

## 2022-10-09 LAB — LAMOTRIGINE LEVEL: Lamotrigine Lvl: 3.1 ug/mL (ref 2.5–15.0)

## 2022-10-11 DIAGNOSIS — F4321 Adjustment disorder with depressed mood: Secondary | ICD-10-CM | POA: Diagnosis not present

## 2022-10-11 MED ORDER — LAMOTRIGINE ER 300 MG PO TB24
ORAL_TABLET | ORAL | 3 refills | Status: DC
Start: 2022-10-11 — End: 2023-01-17

## 2022-10-11 NOTE — Addendum Note (Signed)
Addended by: Van Clines on: 10/11/2022 10:22 AM   Modules accepted: Orders

## 2022-10-11 NOTE — Addendum Note (Signed)
Addended by: Allean Found R on: 10/11/2022 11:51 AM   Modules accepted: Orders

## 2022-10-22 ENCOUNTER — Other Ambulatory Visit: Payer: BC Managed Care – PPO

## 2022-10-25 DIAGNOSIS — F4321 Adjustment disorder with depressed mood: Secondary | ICD-10-CM | POA: Diagnosis not present

## 2022-10-29 ENCOUNTER — Other Ambulatory Visit (INDEPENDENT_AMBULATORY_CARE_PROVIDER_SITE_OTHER): Payer: BC Managed Care – PPO

## 2022-10-29 DIAGNOSIS — Z79899 Other long term (current) drug therapy: Secondary | ICD-10-CM | POA: Diagnosis not present

## 2022-10-29 DIAGNOSIS — G40209 Localization-related (focal) (partial) symptomatic epilepsy and epileptic syndromes with complex partial seizures, not intractable, without status epilepticus: Secondary | ICD-10-CM | POA: Diagnosis not present

## 2022-11-01 ENCOUNTER — Other Ambulatory Visit: Payer: Self-pay | Admitting: Neurology

## 2022-11-01 DIAGNOSIS — G40209 Localization-related (focal) (partial) symptomatic epilepsy and epileptic syndromes with complex partial seizures, not intractable, without status epilepticus: Secondary | ICD-10-CM

## 2022-11-03 DIAGNOSIS — F4321 Adjustment disorder with depressed mood: Secondary | ICD-10-CM | POA: Diagnosis not present

## 2022-11-08 IMAGING — MG MM DIGITAL DIAGNOSTIC UNILAT*R* W/ TOMO W/ CAD
6 series · 6 of 18 positions shown · non-contrast
Comparison: Previous exam(s).

CLINICAL DATA: The patient was called back for a right breast
asymmetry.

EXAM:
DIGITAL DIAGNOSTIC UNILATERAL RIGHT MAMMOGRAM WITH TOMOSYNTHESIS AND
CAD; ULTRASOUND RIGHT BREAST LIMITED
TECHNIQUE: Right digital diagnostic mammography and breast tomosynthesis was
performed. The images were evaluated with computer-aided detection.;
Targeted ultrasound examination of the right breast was performed

[R ML synth-2D]
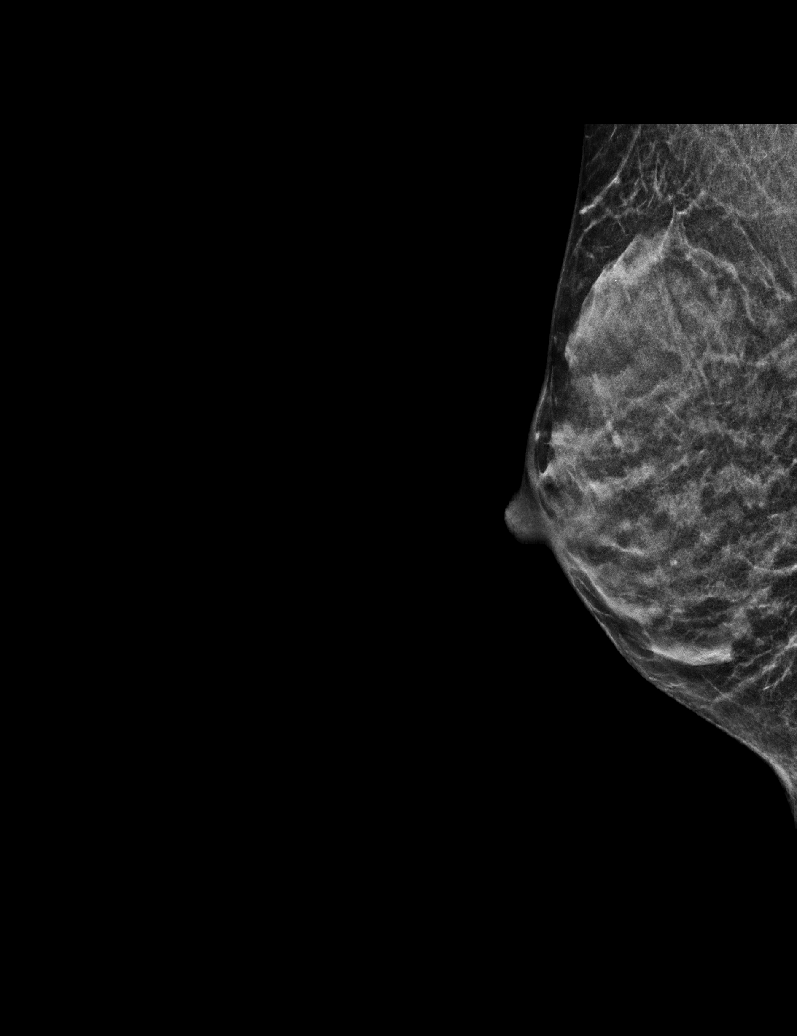

[R MLO synth-2D (1 of 2)]
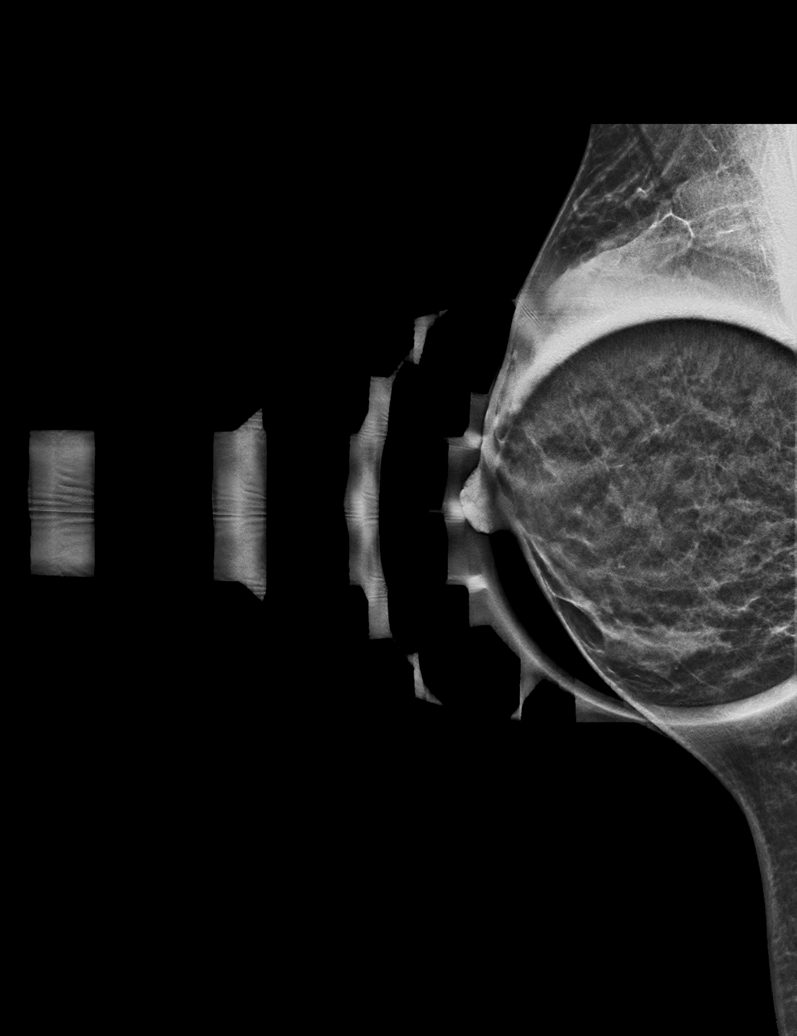

[R MLO synth-2D (2 of 2)]
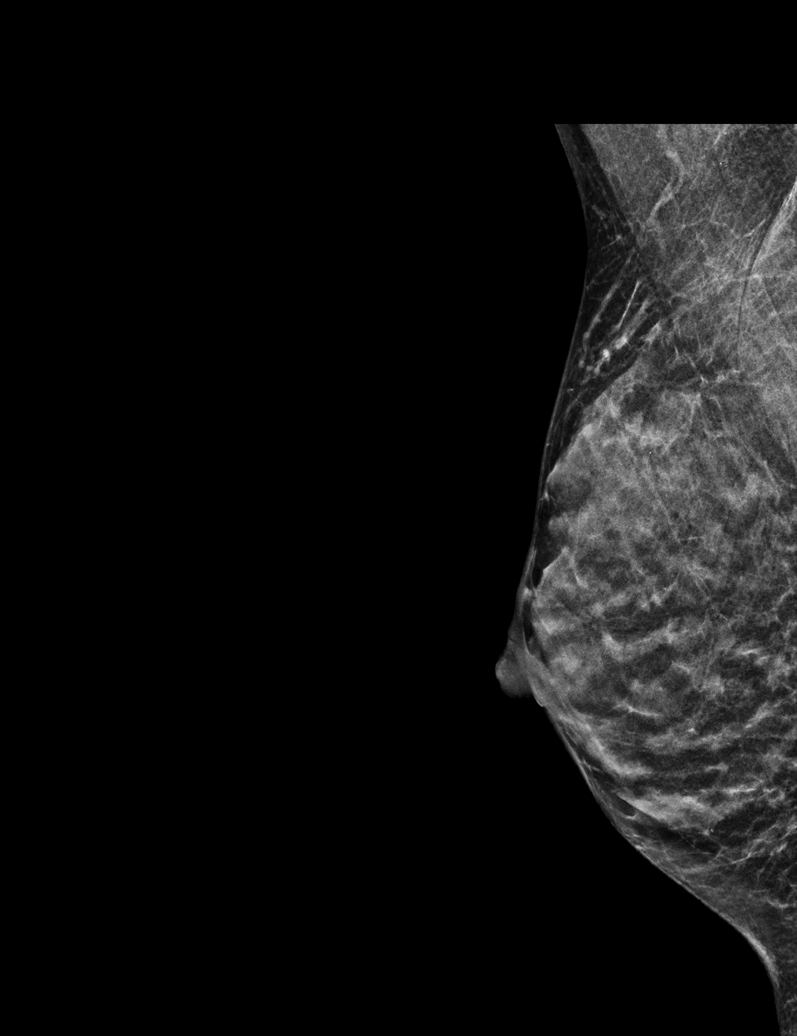

[R MLO tomo (1 of 2) · tomo slice 17/32.0]
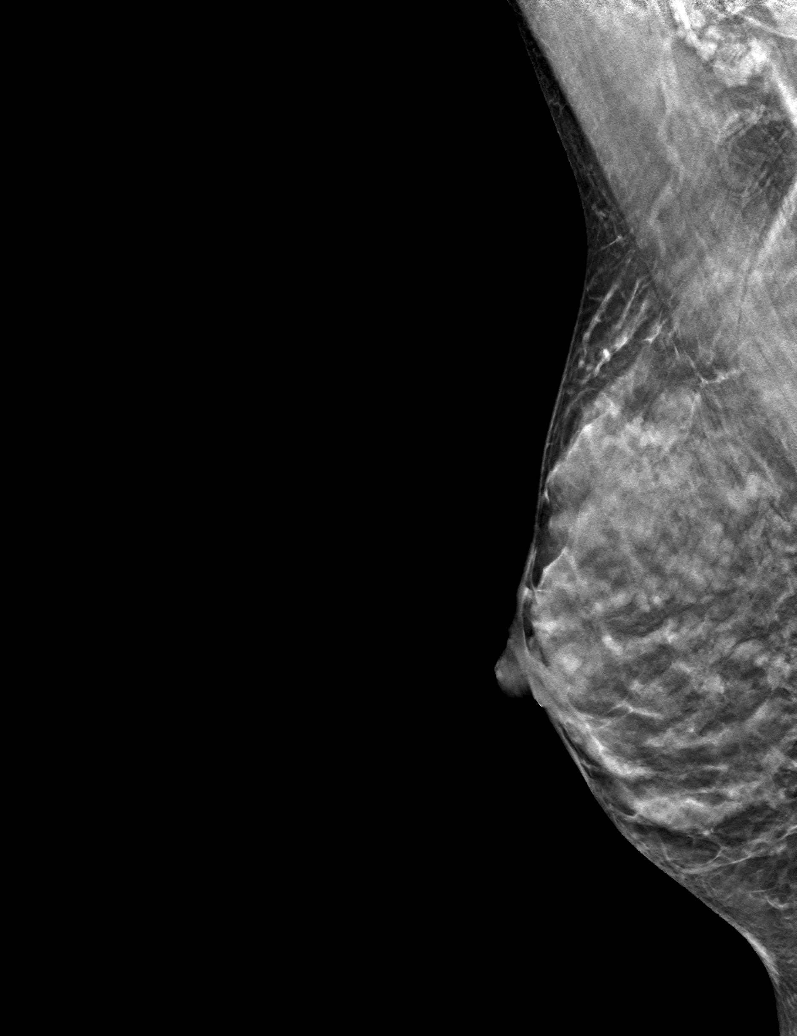

[R ML tomo · tomo slice 18/35.0]
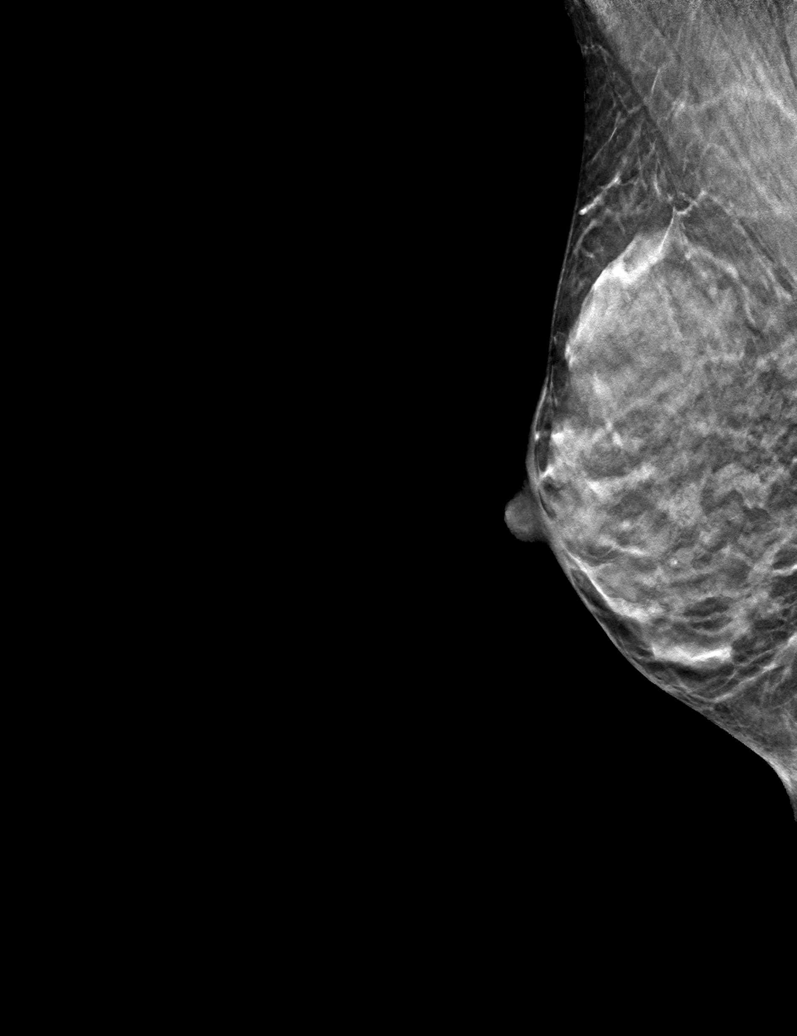

[R MLO tomo (2 of 2) · tomo slice 16/31.0]
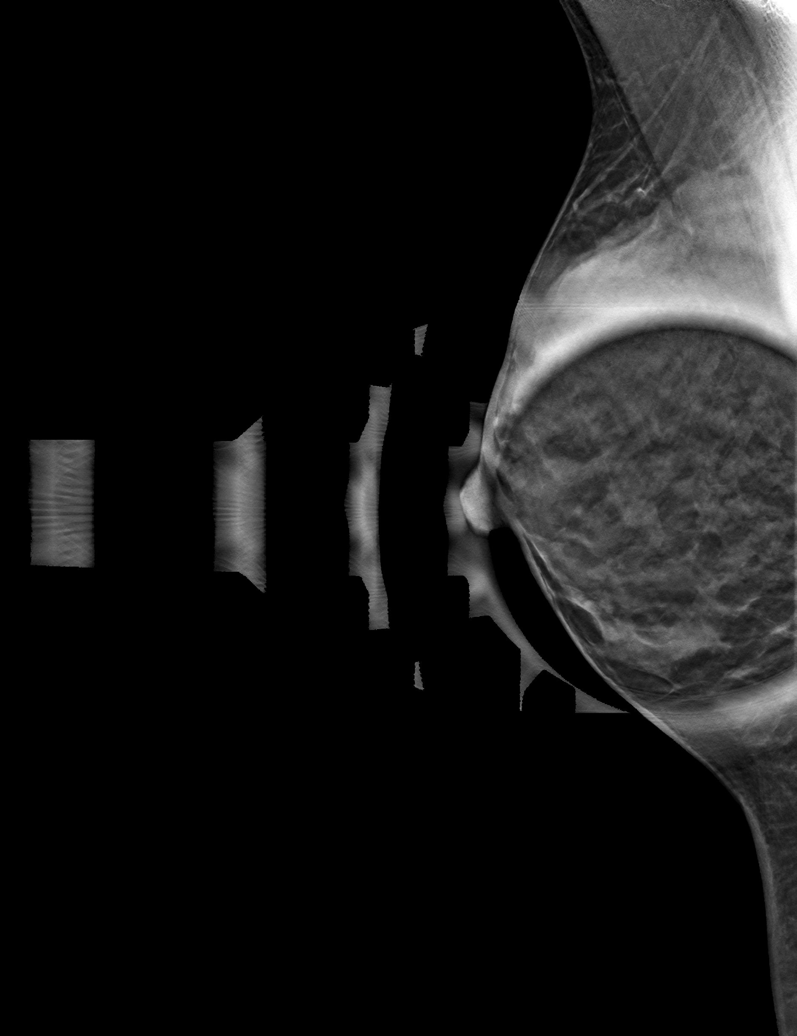

[6 of 18 positions shown; findings below may reference images not displayed]

ACR Breast Density Category c: The breast tissue is heterogeneously
dense, which may obscure small masses.
FINDINGS: The asymmetry appears to represent an island of tissue on the spot
view. The asymmetry resolves on the repeat MLO view.

Targeted ultrasound is performed, showing dense glandular tissue in
the region of the patient's asymmetry. No suspicious masses.
IMPRESSION: No mammographic or sonographic evidence of malignancy.

RECOMMENDATION:
Annual screening mammography.

I have discussed the findings and recommendations with the patient.
If applicable, a reminder letter will be sent to the patient
regarding the next appointment.

BI-RADS CATEGORY  1: Negative.

## 2022-11-11 DIAGNOSIS — F4321 Adjustment disorder with depressed mood: Secondary | ICD-10-CM | POA: Diagnosis not present

## 2022-11-17 DIAGNOSIS — N951 Menopausal and female climacteric states: Secondary | ICD-10-CM | POA: Diagnosis not present

## 2022-11-19 ENCOUNTER — Telehealth: Payer: Self-pay

## 2022-11-19 NOTE — Telephone Encounter (Signed)
Pt called no answer left a voice mail per DPR that Lamictal level looks good, no need to adjust dose

## 2022-11-19 NOTE — Telephone Encounter (Signed)
-----   Message from Van Clines sent at 11/18/2022 10:21 AM EDT ----- Pls let her know the Lamictal level looks good, no need to adjust dose. Thanks

## 2022-11-22 DIAGNOSIS — F4321 Adjustment disorder with depressed mood: Secondary | ICD-10-CM | POA: Diagnosis not present

## 2022-12-01 DIAGNOSIS — F4321 Adjustment disorder with depressed mood: Secondary | ICD-10-CM | POA: Diagnosis not present

## 2022-12-08 DIAGNOSIS — F4321 Adjustment disorder with depressed mood: Secondary | ICD-10-CM | POA: Diagnosis not present

## 2022-12-23 DIAGNOSIS — F4321 Adjustment disorder with depressed mood: Secondary | ICD-10-CM | POA: Diagnosis not present

## 2022-12-29 DIAGNOSIS — F4321 Adjustment disorder with depressed mood: Secondary | ICD-10-CM | POA: Diagnosis not present

## 2023-01-03 ENCOUNTER — Telehealth: Payer: Self-pay | Admitting: Neurology

## 2023-01-03 DIAGNOSIS — G40209 Localization-related (focal) (partial) symptomatic epilepsy and epileptic syndromes with complex partial seizures, not intractable, without status epilepticus: Secondary | ICD-10-CM

## 2023-01-03 DIAGNOSIS — Z79899 Other long term (current) drug therapy: Secondary | ICD-10-CM

## 2023-01-03 NOTE — Telephone Encounter (Signed)
Let's check Lamictal level 2 weeks after hormone patch increase, thanks

## 2023-01-03 NOTE — Telephone Encounter (Signed)
Caller stated she would like to speak with nurse about mediation and possible blood work

## 2023-01-03 NOTE — Telephone Encounter (Signed)
Pt had to have her hormone patch increased she is asking if we need to check her lamotrigine level to see if we need to adjust her medication? It advised that I will call her back and if she does I ill mail her the requisition so she can go near her to get it done,

## 2023-01-03 NOTE — Telephone Encounter (Signed)
Pt called no answer left a voice mail that we will check Lamictal level 2 weeks after hormone patch increase order placed in mail for pt,

## 2023-01-04 DIAGNOSIS — R351 Nocturia: Secondary | ICD-10-CM | POA: Diagnosis not present

## 2023-01-04 DIAGNOSIS — R350 Frequency of micturition: Secondary | ICD-10-CM | POA: Diagnosis not present

## 2023-01-04 DIAGNOSIS — R3915 Urgency of urination: Secondary | ICD-10-CM | POA: Diagnosis not present

## 2023-01-05 DIAGNOSIS — F4321 Adjustment disorder with depressed mood: Secondary | ICD-10-CM | POA: Diagnosis not present

## 2023-01-12 DIAGNOSIS — F4321 Adjustment disorder with depressed mood: Secondary | ICD-10-CM | POA: Diagnosis not present

## 2023-01-14 DIAGNOSIS — F410 Panic disorder [episodic paroxysmal anxiety] without agoraphobia: Secondary | ICD-10-CM | POA: Diagnosis not present

## 2023-01-14 DIAGNOSIS — F3175 Bipolar disorder, in partial remission, most recent episode depressed: Secondary | ICD-10-CM | POA: Diagnosis not present

## 2023-01-14 DIAGNOSIS — F4321 Adjustment disorder with depressed mood: Secondary | ICD-10-CM | POA: Diagnosis not present

## 2023-01-14 DIAGNOSIS — F3174 Bipolar disorder, in full remission, most recent episode manic: Secondary | ICD-10-CM | POA: Diagnosis not present

## 2023-01-17 ENCOUNTER — Telehealth: Payer: Self-pay | Admitting: Neurology

## 2023-01-17 DIAGNOSIS — G40209 Localization-related (focal) (partial) symptomatic epilepsy and epileptic syndromes with complex partial seizures, not intractable, without status epilepticus: Secondary | ICD-10-CM | POA: Diagnosis not present

## 2023-01-17 DIAGNOSIS — Z79899 Other long term (current) drug therapy: Secondary | ICD-10-CM | POA: Diagnosis not present

## 2023-01-17 MED ORDER — LAMOTRIGINE ER 200 MG PO TB24: ORAL_TABLET | ORAL | 3 refills | Status: DC

## 2023-01-17 NOTE — Telephone Encounter (Signed)
Caller stated would like to speak with nurse about LamoTRIgine 300 MG TB24 24 hour tablet [[id number]] medication and how its making her off balanced.

## 2023-01-17 NOTE — Telephone Encounter (Signed)
Pls let her know level will take a few days to come back, but we can increase the Lamotrigine ER to 400mg  daily. It comes in 200mg  tablets: I sent in an Rx to take 2 tablets once a day to [facility] on [address], thanks

## 2023-01-17 NOTE — Telephone Encounter (Signed)
Pt called an informed that  level will take a few days to come back, but we can increase the Lamotrigine ER to 400mg  daily. It comes in 200mg  tablets: I sent in an Rx to take 2 tablets once a day to [facility] on [address],

## 2023-01-17 NOTE — Telephone Encounter (Signed)
Pt stated that she is taken the hormones she thinks her lamotigine level is ow she had the blood work done today, she is tearful her psych Dr is asking is she can increase her lamotrigine to 400 mg to help with her depression so that she will not have to start a 2nd medication

## 2023-01-18 LAB — LAMOTRIGINE LEVEL: Lamotrigine Lvl: 8.3 ug/mL (ref 2.0–20.0)

## 2023-01-19 DIAGNOSIS — F4321 Adjustment disorder with depressed mood: Secondary | ICD-10-CM | POA: Diagnosis not present

## 2023-01-20 ENCOUNTER — Telehealth: Payer: Self-pay

## 2023-01-20 NOTE — Telephone Encounter (Signed)
-----   Message from Van Clines sent at 01/19/2023 12:25 PM EDT ----- Pls let her know that her Lamictal level was actually higher than typical, 8.3. Her baseline is around 4. Did she have bloodwork done at the same time as prior blood draws? How is she feeling on the increased 400mg  dose of Lamotrigine? Thanks

## 2023-01-20 NOTE — Telephone Encounter (Signed)
Pt called an informed  her Lamictal level was actually higher than typical, 8.3. Her baseline is around 4. Did she have bloodwork done at the same time as prior blood draws? She was unsure.  How is she feeling on the increased 400mg  dose of Lamotrigine? She is feeling better

## 2023-02-18 DIAGNOSIS — E611 Iron deficiency: Secondary | ICD-10-CM | POA: Diagnosis not present

## 2023-02-18 DIAGNOSIS — R7989 Other specified abnormal findings of blood chemistry: Secondary | ICD-10-CM | POA: Diagnosis not present

## 2023-02-18 DIAGNOSIS — R7301 Impaired fasting glucose: Secondary | ICD-10-CM | POA: Diagnosis not present

## 2023-02-18 DIAGNOSIS — D649 Anemia, unspecified: Secondary | ICD-10-CM | POA: Diagnosis not present

## 2023-02-25 DIAGNOSIS — G40909 Epilepsy, unspecified, not intractable, without status epilepticus: Secondary | ICD-10-CM | POA: Diagnosis not present

## 2023-02-25 DIAGNOSIS — Z1331 Encounter for screening for depression: Secondary | ICD-10-CM | POA: Diagnosis not present

## 2023-02-25 DIAGNOSIS — Z1339 Encounter for screening examination for other mental health and behavioral disorders: Secondary | ICD-10-CM | POA: Diagnosis not present

## 2023-02-25 DIAGNOSIS — Z0000 Encounter for general adult medical examination without abnormal findings: Secondary | ICD-10-CM | POA: Diagnosis not present

## 2023-02-25 DIAGNOSIS — R82998 Other abnormal findings in urine: Secondary | ICD-10-CM | POA: Diagnosis not present

## 2023-04-04 DIAGNOSIS — F3174 Bipolar disorder, in full remission, most recent episode manic: Secondary | ICD-10-CM | POA: Diagnosis not present

## 2023-04-04 DIAGNOSIS — F900 Attention-deficit hyperactivity disorder, predominantly inattentive type: Secondary | ICD-10-CM | POA: Diagnosis not present

## 2023-04-04 DIAGNOSIS — F3132 Bipolar disorder, current episode depressed, moderate: Secondary | ICD-10-CM | POA: Diagnosis not present

## 2023-04-04 DIAGNOSIS — F410 Panic disorder [episodic paroxysmal anxiety] without agoraphobia: Secondary | ICD-10-CM | POA: Diagnosis not present

## 2023-04-19 DIAGNOSIS — R3915 Urgency of urination: Secondary | ICD-10-CM | POA: Diagnosis not present

## 2023-04-19 DIAGNOSIS — R350 Frequency of micturition: Secondary | ICD-10-CM | POA: Diagnosis not present

## 2023-04-19 DIAGNOSIS — M6281 Muscle weakness (generalized): Secondary | ICD-10-CM | POA: Diagnosis not present

## 2023-04-19 DIAGNOSIS — M6289 Other specified disorders of muscle: Secondary | ICD-10-CM | POA: Diagnosis not present

## 2023-05-05 DIAGNOSIS — F3176 Bipolar disorder, in full remission, most recent episode depressed: Secondary | ICD-10-CM | POA: Diagnosis not present

## 2023-05-05 DIAGNOSIS — F3174 Bipolar disorder, in full remission, most recent episode manic: Secondary | ICD-10-CM | POA: Diagnosis not present

## 2023-05-05 DIAGNOSIS — F410 Panic disorder [episodic paroxysmal anxiety] without agoraphobia: Secondary | ICD-10-CM | POA: Diagnosis not present

## 2023-05-05 DIAGNOSIS — F900 Attention-deficit hyperactivity disorder, predominantly inattentive type: Secondary | ICD-10-CM | POA: Diagnosis not present

## 2023-05-19 DIAGNOSIS — F3174 Bipolar disorder, in full remission, most recent episode manic: Secondary | ICD-10-CM | POA: Diagnosis not present

## 2023-05-19 DIAGNOSIS — F3176 Bipolar disorder, in full remission, most recent episode depressed: Secondary | ICD-10-CM | POA: Diagnosis not present

## 2023-05-19 DIAGNOSIS — F410 Panic disorder [episodic paroxysmal anxiety] without agoraphobia: Secondary | ICD-10-CM | POA: Diagnosis not present

## 2023-05-19 DIAGNOSIS — F900 Attention-deficit hyperactivity disorder, predominantly inattentive type: Secondary | ICD-10-CM | POA: Diagnosis not present

## 2023-05-25 DIAGNOSIS — Z01411 Encounter for gynecological examination (general) (routine) with abnormal findings: Secondary | ICD-10-CM | POA: Diagnosis not present

## 2023-05-25 DIAGNOSIS — Z1389 Encounter for screening for other disorder: Secondary | ICD-10-CM | POA: Diagnosis not present

## 2023-05-25 DIAGNOSIS — N951 Menopausal and female climacteric states: Secondary | ICD-10-CM | POA: Diagnosis not present

## 2023-05-25 DIAGNOSIS — R8290 Unspecified abnormal findings in urine: Secondary | ICD-10-CM | POA: Diagnosis not present

## 2023-05-25 DIAGNOSIS — T753XXA Motion sickness, initial encounter: Secondary | ICD-10-CM | POA: Diagnosis not present

## 2023-05-25 DIAGNOSIS — Z130 Encounter for screening for diseases of the blood and blood-forming organs and certain disorders involving the immune mechanism: Secondary | ICD-10-CM | POA: Diagnosis not present

## 2023-06-02 DIAGNOSIS — Z1231 Encounter for screening mammogram for malignant neoplasm of breast: Secondary | ICD-10-CM | POA: Diagnosis not present

## 2023-06-02 DIAGNOSIS — R92333 Mammographic heterogeneous density, bilateral breasts: Secondary | ICD-10-CM | POA: Diagnosis not present

## 2023-07-05 DIAGNOSIS — R350 Frequency of micturition: Secondary | ICD-10-CM | POA: Diagnosis not present

## 2023-07-05 DIAGNOSIS — R3915 Urgency of urination: Secondary | ICD-10-CM | POA: Diagnosis not present

## 2023-07-06 DIAGNOSIS — F3174 Bipolar disorder, in full remission, most recent episode manic: Secondary | ICD-10-CM | POA: Diagnosis not present

## 2023-07-06 DIAGNOSIS — F900 Attention-deficit hyperactivity disorder, predominantly inattentive type: Secondary | ICD-10-CM | POA: Diagnosis not present

## 2023-07-06 DIAGNOSIS — F410 Panic disorder [episodic paroxysmal anxiety] without agoraphobia: Secondary | ICD-10-CM | POA: Diagnosis not present

## 2023-07-06 DIAGNOSIS — F3132 Bipolar disorder, current episode depressed, moderate: Secondary | ICD-10-CM | POA: Diagnosis not present

## 2023-07-26 DIAGNOSIS — M779 Enthesopathy, unspecified: Secondary | ICD-10-CM | POA: Diagnosis not present

## 2023-09-01 ENCOUNTER — Ambulatory Visit (INDEPENDENT_AMBULATORY_CARE_PROVIDER_SITE_OTHER): Payer: BC Managed Care – PPO | Admitting: Neurology

## 2023-09-01 ENCOUNTER — Encounter: Payer: Self-pay | Admitting: Neurology

## 2023-09-01 VITALS — BP 106/69 | HR 99 | Ht 66.0 in | Wt 130.6 lb

## 2023-09-01 DIAGNOSIS — G40209 Localization-related (focal) (partial) symptomatic epilepsy and epileptic syndromes with complex partial seizures, not intractable, without status epilepticus: Secondary | ICD-10-CM | POA: Diagnosis not present

## 2023-09-01 MED ORDER — LAMOTRIGINE ER 200 MG PO TB24: ORAL_TABLET | ORAL | 4 refills | Status: DC

## 2023-09-01 NOTE — Patient Instructions (Signed)
 Good to see you doing well! Continue Lamotrigine  ER 200mg : Take 2 tablets daily. Follow-up in 1 year, call for any changes.    Seizure Precautions: 1. If medication has been prescribed for you to prevent seizures, take it exactly as directed.  Do not stop taking the medicine without talking to your doctor first, even if you have not had a seizure in a long time.   2. Avoid activities in which a seizure would cause danger to yourself or to others.  Don't operate dangerous machinery, swim alone, or climb in high or dangerous places, such as on ladders, roofs, or girders.  Do not drive unless your doctor says you may.  3. If you have any warning that you may have a seizure, lay down in a safe place where you can't hurt yourself.    4.  No driving for 6 months from last seizure, as per [place]  state law.   Please refer to the following link on the Epilepsy Foundation of America's website for more information: http://www.epilepsyfoundation.org/answerplace/Social/driving/drivingu.cfm   5.  Maintain good sleep hygiene. Avoid alcohol .  6.  Contact your doctor if you have any problems that may be related to the medicine you are taking.  7.  Call 911 and bring the patient back to the ED if:        A.  The seizure lasts longer than 5 minutes.       B.  The patient doesn't awaken shortly after the seizure  C.  The patient has new problems such as difficulty seeing, speaking or moving  D.  The patient was injured during the seizure  E.  The patient has a temperature over 102 F (39C)  F.  The patient vomited and now is having trouble breathing

## 2023-09-01 NOTE — Progress Notes (Signed)
 NEUROLOGY FOLLOW UP OFFICE NOTE  Sherry Tate [id number] [birth year]  HISTORY OF PRESENT ILLNESS: I had the pleasure of seeing Sherry Tate in follow-up in the neurology clinic on 09/01/2023.  The patient was last seen a year ago for seizures.She is again accompanied by her daughter who helps supplement the history today.  Records and images were personally reviewed where available. She contacted our office in 12/2022 that her depression was worse and after speaking to her psychiatrist, asked to increase Lamotrigine  dose. Dose increased to 400mg  daily. She denies any side effects and reports depression is much better. No seizures since 2020. She denies any staring/unresponsive episodes, gaps in time, olfactory/gustatory hallucinations, focal numbness/tingling/weakness, myoclonic jerks. No headaches, dizziness, vision changes, no falls. She gets 7 hours of sleep. Mood is good.    History on Initial Assessment 04/23/2014: This is a pleasant 53 yo RH woman with a history of bipolar disorder, in her usual state of health until around November/December 2015 when she had a nocturnal seizure witnessed by her husband. She was stiff and shaking for 20-30 seconds, then went back to sleep. She woke up with her muscles sore, no focal weakness, no tongue bite or incontinence. It appears she did nto seek medical attention until after another seizure on 04/12/2014 in the morning while she was doing spelling words with her daughter. She recalls saying they had to get going, then she suddenly fell back shaking and moaning. Her daughter called her husband, and when she came to, he had come back home. Her daughter reports she was answering "no" to all the questions. She is amnestic of the event. She denies any prior warning symptoms.  She denies any olfactory/gustatory hallucinations, deja vu, rising epigastric sensation, focal numbness/tingling/weakness, myoclonic jerks. She denies any alcohol  or sleep deprivation  prior to the seizures.   She had been taking Lamictal  200mg  qAM for 3-4 years but did not feel it was helping and tapered it off after discussing this with her psychiatrist. She had been completely off Lamictal  for 2 months. She had asked to be restarted on Zoloft, which he had used for many years in the past. She had been taking Zoloft for 2 months when the seizure occurred. She takes Xanax  XR 3mg  every morning since 2015, no change in dose, no missed doses. She has been taking Saphris for 3-4 years, which helps her sleep. For several years she has had mild 4/10 daily headaches that resolve after 30 minutes of taking Excedrin. There is no associated nausea, vomiting, photo/phonophobia, visual obscurations. She reports her memory is "horrible," she usually forgets what she went into a room for. She does not get lost driving, does not forget to take her medications.  Epilepsy Risk Factors:  She had a normal birth and early development.  There is no history of febrile convulsions, CNS infections such as meningitis/encephalitis, significant traumatic brain injury, neurosurgical procedures, or family history of seizures.  Diagnostic Data: I personally reviewed MRI brain with and without contrast which did not show any acute changes. There was asymmetry noted in the temporal lobes, with temporal horn more prominent in the right than left. 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   Routine EEG showed excess beta activity from benzodiazepine use.   24-hour EEG had shown rare left frontal sharp waves exclusively in sleep.   Her trough Lamictal  level in  May 2016 was 3.6. PAST MEDICAL HISTORY: Past Medical History:  Diagnosis Date   Bipolar disorder (HCC)    Seizures (HCC)     MEDICATIONS: Current Outpatient Medications on File Prior to Visit  Medication Sig Dispense Refill    ADDERALL  XR 30 MG 24 hr capsule Take 30 mg by mouth 2 (two) times daily.   0   ALPRAZolam  (XANAX  XR) 3 MG 24 hr tablet Take 1 tablet by mouth once daily 90 tablet 0   amphetamine -dextroamphetamine  (ADDERALL  XR) 30 MG 24 hr capsule Take 2 capsules by mouth every morning 60 capsule 0   amphetamine -dextroamphetamine  (ADDERALL  XR) 30 MG 24 hr capsule Take 2 capsules (60 mg total) by mouth every morning. 60 capsule 0   estradiol (DOTTI) 0.075 MG/24HR APPLY 1 PATCH TOPICALLY TWICE A WEEK     LamoTRIgine  200 MG TB24 24 hour tablet Take 2 tablets daily 180 tablet 3   linaclotide (LINZESS) 290 MCG CAPS capsule Take 290 mcg by mouth daily.     Melatonin 10 MG TABS Take by mouth.     sertraline (ZOLOFT) 25 MG tablet Take 25 mg by mouth daily.     Asenapine Maleate 10 MG SUBL Place 2 tablets under the tongue 2 (two) times daily.     oxybutynin  (DITROPAN ) 5 MG tablet Take 5 mg by mouth 2 (two) times daily.     No current facility-administered medications on file prior to visit.    ALLERGIES: Allergies  Allergen Reactions   Sulfa Antibiotics Other (See Comments)    jaundice    FAMILY HISTORY: Family History  Problem Relation Age of Onset   Dementia Mother     SOCIAL HISTORY: Social History   Socioeconomic History   Marital status: Married    Spouse name: Not on file   Number of children: 2   Years of education: Not on file   Highest education level: Not on file  Occupational History   Not on file  Tobacco Use   Smoking status: Former   Smokeless tobacco: Never  Vaping Use   Vaping status: Former  Substance and Sexual Activity   Alcohol  use: No    Alcohol /week: 0.0 standard drinks of alcohol    Drug use: Yes    Types: Marijuana   Sexual activity: Yes    Partners: Male  Other Topics Concern   Not on file  Social History Narrative   Right handed      Highest level of eduPsychiatrist      Lives with husband two kids      Two story home   Social Drivers of Health   Financial  Resource Strain: Not on file  Food Insecurity: Not on file  Transportation Needs: Not on file  Physical Activity: Not on file  Stress: Not on file  Social Connections: Unknown (06/04/2022)   Received from [facility], [facility]   Social Network    Social Network: Not on file  Intimate Partner Violence: Unknown (06/04/2022)   Received from  [facility]   HITS    Physically Hurt: Not on file    Insult or Talk Down To: Not on file    Threaten Physical Harm: Not on file    Scream or Curse: Not on file     PHYSICAL EXAM: Vitals:   09/01/23 0944  BP: 106/69  Pulse: 99  SpO2: 98%   General: No acute distress Head:  Normocephalic/atraumatic Skin/Extremities: No rash, no edema Neurological Exam: alert and oriented to  person, place, and time. No aphasia or dysarthria. Fund of knowledge is appropriate.  Attention and concentration are normal.   Cranial nerves: Pupils equal, round. Extraocular movements intact with no nystagmus. Visual fields full.  No facial asymmetry.  Motor: Bulk and tone normal, muscle strength 5/5 throughout with no pronator drift.   Finger to nose testing intact.  Gait narrow-based and steady, able to tandem walk adequately.  Romberg negative.   IMPRESSION: This is a pleasant 53 yo RH woman with a history of bipolar disorder with rare generalized convulsions. Her 24-hour EEG had shown rare left frontal epileptiform discharges. MRI brain had shown hippocampal asymmetry, concerning for possible migrational abnormality in the left temporal lobe. 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 She has been seizure-free since February 2020. Lamotrigine  was increased in 12/2022 for mood, which is much better. Continue Lamotrigine  ER 200mg : take 2 tabs daily (400mg  daily). She is aware of [place] driving laws to stop driving after a seizure until 6 months seizure-free. Follow-up in 1 year, call for any changes.    Thank you for allowing me to participate in her care.  Please do not hesitate to call for any questions or concerns.    Rayfield Cairo, M.D.   CC: Dr. Bernetta Brilliant

## 2023-09-15 DIAGNOSIS — F900 Attention-deficit hyperactivity disorder, predominantly inattentive type: Secondary | ICD-10-CM | POA: Diagnosis not present

## 2023-09-15 DIAGNOSIS — F3176 Bipolar disorder, in full remission, most recent episode depressed: Secondary | ICD-10-CM | POA: Diagnosis not present

## 2023-09-15 DIAGNOSIS — F3174 Bipolar disorder, in full remission, most recent episode manic: Secondary | ICD-10-CM | POA: Diagnosis not present

## 2023-09-15 DIAGNOSIS — F410 Panic disorder [episodic paroxysmal anxiety] without agoraphobia: Secondary | ICD-10-CM | POA: Diagnosis not present

## 2023-10-14 DIAGNOSIS — F410 Panic disorder [episodic paroxysmal anxiety] without agoraphobia: Secondary | ICD-10-CM | POA: Diagnosis not present

## 2023-10-14 DIAGNOSIS — F3176 Bipolar disorder, in full remission, most recent episode depressed: Secondary | ICD-10-CM | POA: Diagnosis not present

## 2023-10-14 DIAGNOSIS — F3174 Bipolar disorder, in full remission, most recent episode manic: Secondary | ICD-10-CM | POA: Diagnosis not present

## 2023-10-14 DIAGNOSIS — F900 Attention-deficit hyperactivity disorder, predominantly inattentive type: Secondary | ICD-10-CM | POA: Diagnosis not present

## 2023-12-09 ENCOUNTER — Telehealth: Payer: Self-pay | Admitting: Neurology

## 2023-12-09 NOTE — Telephone Encounter (Signed)
 Pt is calling in stating that she would like to change her lamotrigine  200 MG twice a day and would like to see if she can start taking 300 MG a day.  Pt stated that she doesn't have that issue any longer from when she was increased to the 400 MG a day.  Pharm: [facility] on [address]  Pt would like to have a call back.

## 2023-12-09 NOTE — Telephone Encounter (Signed)
**Note De-identified  [place] Obfuscation** Please advise 

## 2023-12-13 MED ORDER — LAMOTRIGINE ER 300 MG PO TB24: ORAL_TABLET | ORAL | 3 refills | Status: DC

## 2023-12-13 NOTE — Telephone Encounter (Signed)
 Notified pt -sent Lamotrigine  300 mg w/ instructions and d/c 200 mg tab once receive new Rx. Pt voiced understanding.

## 2023-12-13 NOTE — Telephone Encounter (Signed)
 Pls let her know I sent in an updated prescription for Lamotrigine  ER 300mg : Take 1 tablet daily. She can stop the 200mg  tablets once she gets her new prescription. Let us  know if any issues as she reduces dose. Thanks

## 2023-12-13 NOTE — Addendum Note (Signed)
 Addended by: GEORJEAN DARICE HERO on: 12/13/2023 10:15 AM   Modules accepted: Orders

## 2024-02-20 ENCOUNTER — Encounter: Payer: Self-pay | Admitting: Neurology

## 2024-02-28 DIAGNOSIS — F900 Attention-deficit hyperactivity disorder, predominantly inattentive type: Secondary | ICD-10-CM | POA: Diagnosis not present

## 2024-02-28 DIAGNOSIS — F3174 Bipolar disorder, in full remission, most recent episode manic: Secondary | ICD-10-CM | POA: Diagnosis not present

## 2024-02-28 DIAGNOSIS — F3176 Bipolar disorder, in full remission, most recent episode depressed: Secondary | ICD-10-CM | POA: Diagnosis not present

## 2024-02-28 DIAGNOSIS — F410 Panic disorder [episodic paroxysmal anxiety] without agoraphobia: Secondary | ICD-10-CM | POA: Diagnosis not present

## 2024-04-16 ENCOUNTER — Other Ambulatory Visit: Payer: Self-pay

## 2024-04-16 MED ORDER — LAMOTRIGINE ER 300 MG PO TB24: ORAL_TABLET | ORAL | 1 refills | Status: AC

## 2024-08-22 ENCOUNTER — Ambulatory Visit: Admitting: Neurology

## 2024-08-31 ENCOUNTER — Ambulatory Visit: Admitting: Neurology

## 2024-09-24 ENCOUNTER — Ambulatory Visit: Admitting: Neurology
